# Patient Record
Sex: Male | Born: 1935 | Race: White | Hispanic: No | Marital: Married | Smoking: Former smoker
Health system: Southern US, Community
[De-identification: ages and names within clinical notes are randomized; demographics above are authoritative.]

## PROBLEM LIST (undated history)

## (undated) DIAGNOSIS — I10 Essential (primary) hypertension: Secondary | ICD-10-CM

## (undated) DIAGNOSIS — I509 Heart failure, unspecified: Secondary | ICD-10-CM

## (undated) DIAGNOSIS — E785 Hyperlipidemia, unspecified: Secondary | ICD-10-CM

## (undated) DIAGNOSIS — I2510 Atherosclerotic heart disease of native coronary artery without angina pectoris: Secondary | ICD-10-CM

## (undated) DIAGNOSIS — N189 Chronic kidney disease, unspecified: Secondary | ICD-10-CM

## (undated) DIAGNOSIS — J449 Chronic obstructive pulmonary disease, unspecified: Secondary | ICD-10-CM

---

## 2007-02-23 ENCOUNTER — Emergency Department: Payer: Self-pay | Admitting: Emergency Medicine

## 2008-02-12 IMAGING — CR DG HAND COMPLETE 3+V*L*
1 series · 3 of 3 positions shown · non-contrast
Comparison: none

REASON FOR EXAM: trauma
COMMENTS:

[Series 1: view not recorded · 0.17mm/px · 3 of 3 slices shown]
[im 1/3]
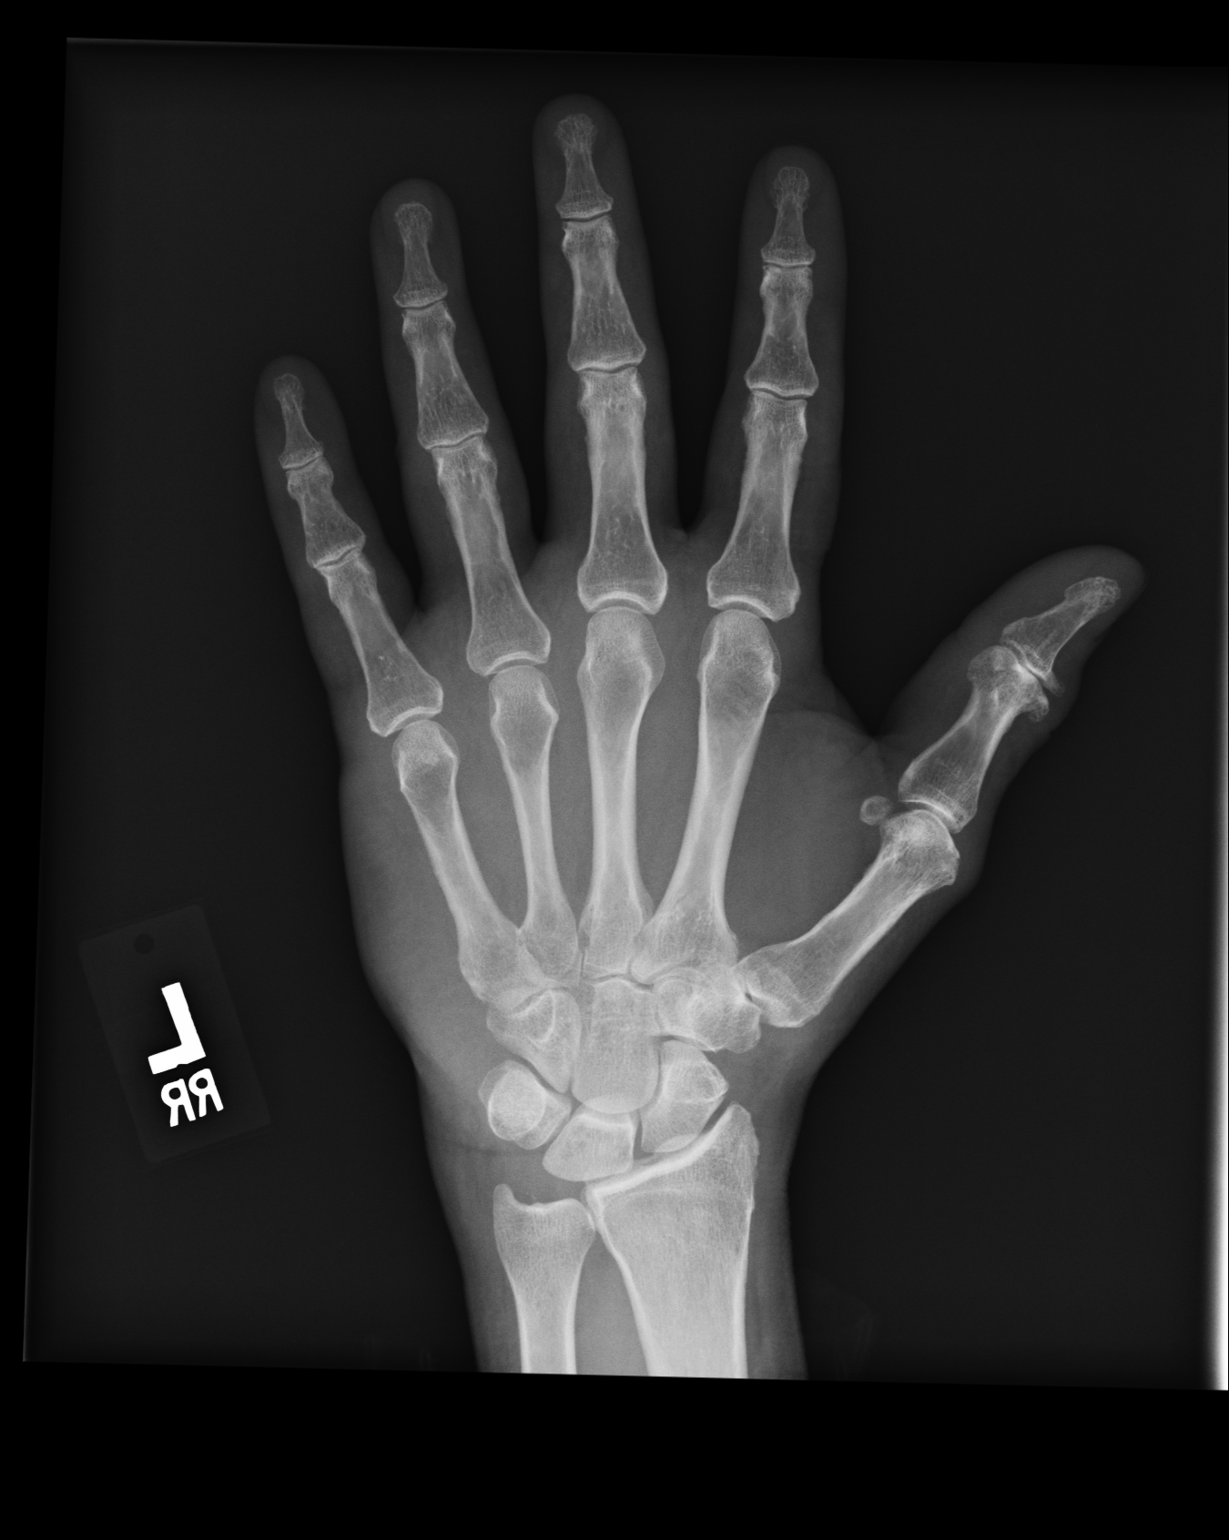
[im 2/3]
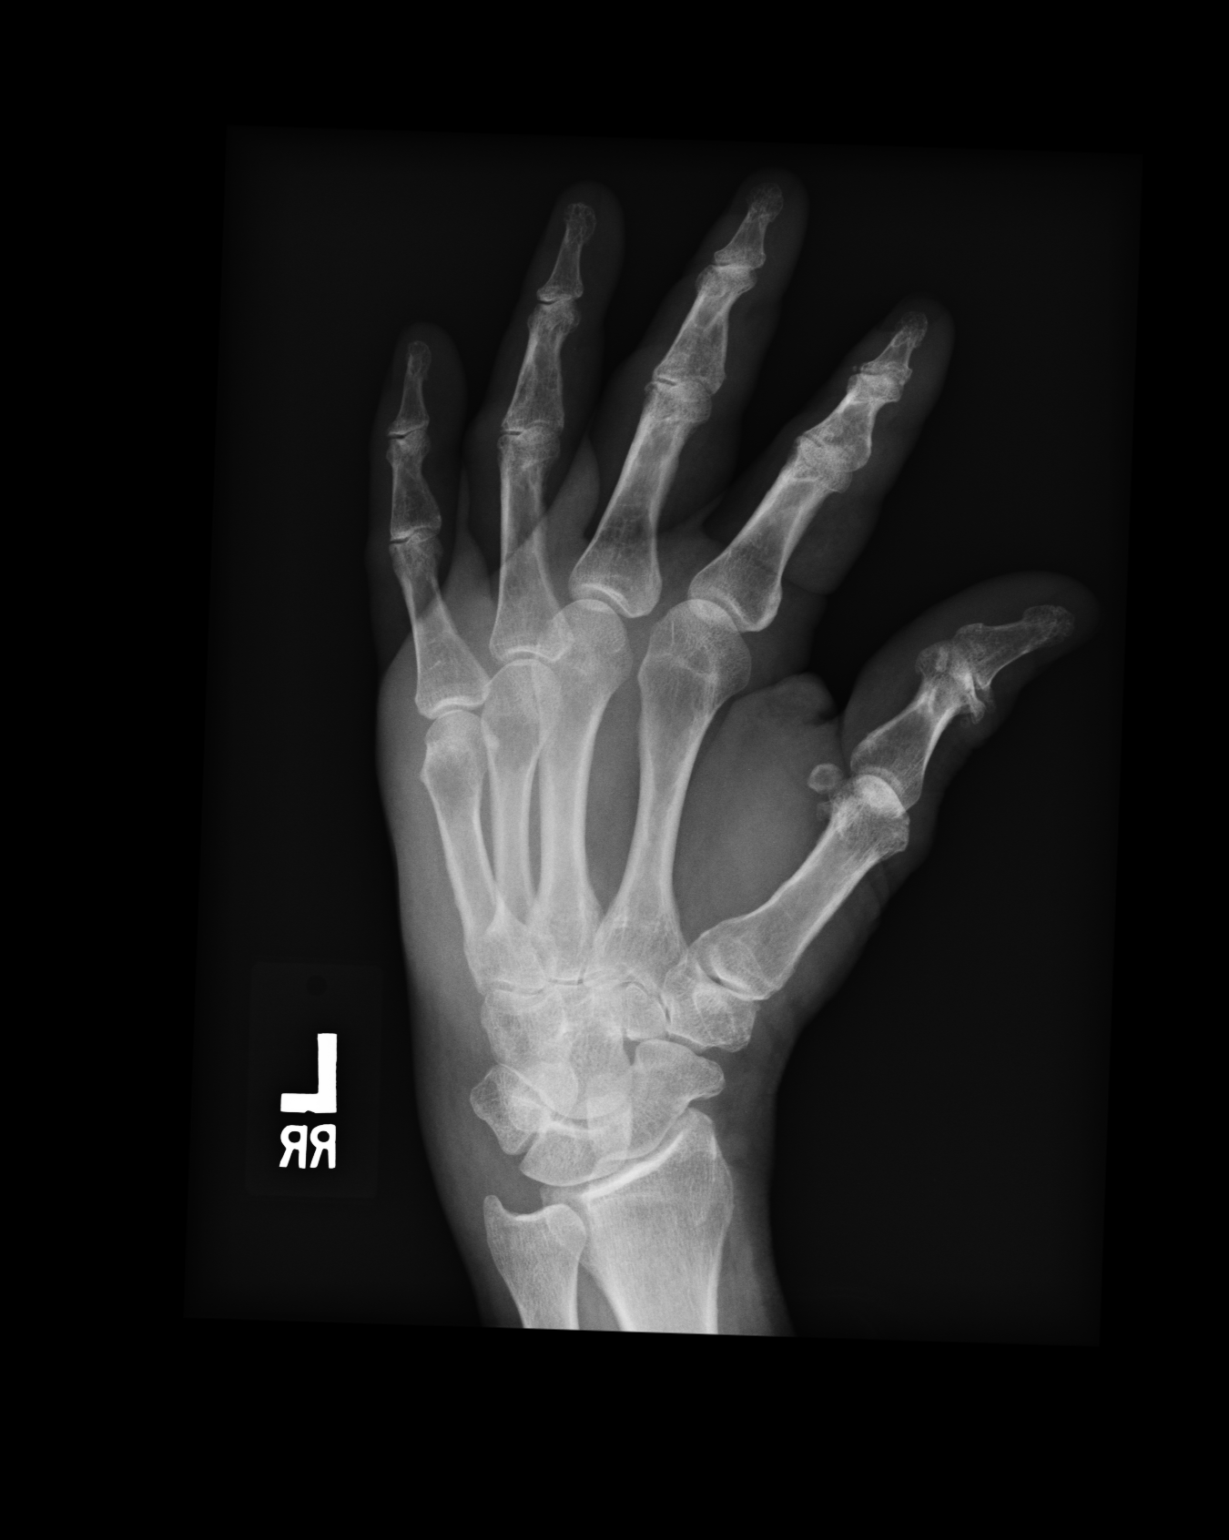
[im 3/3]
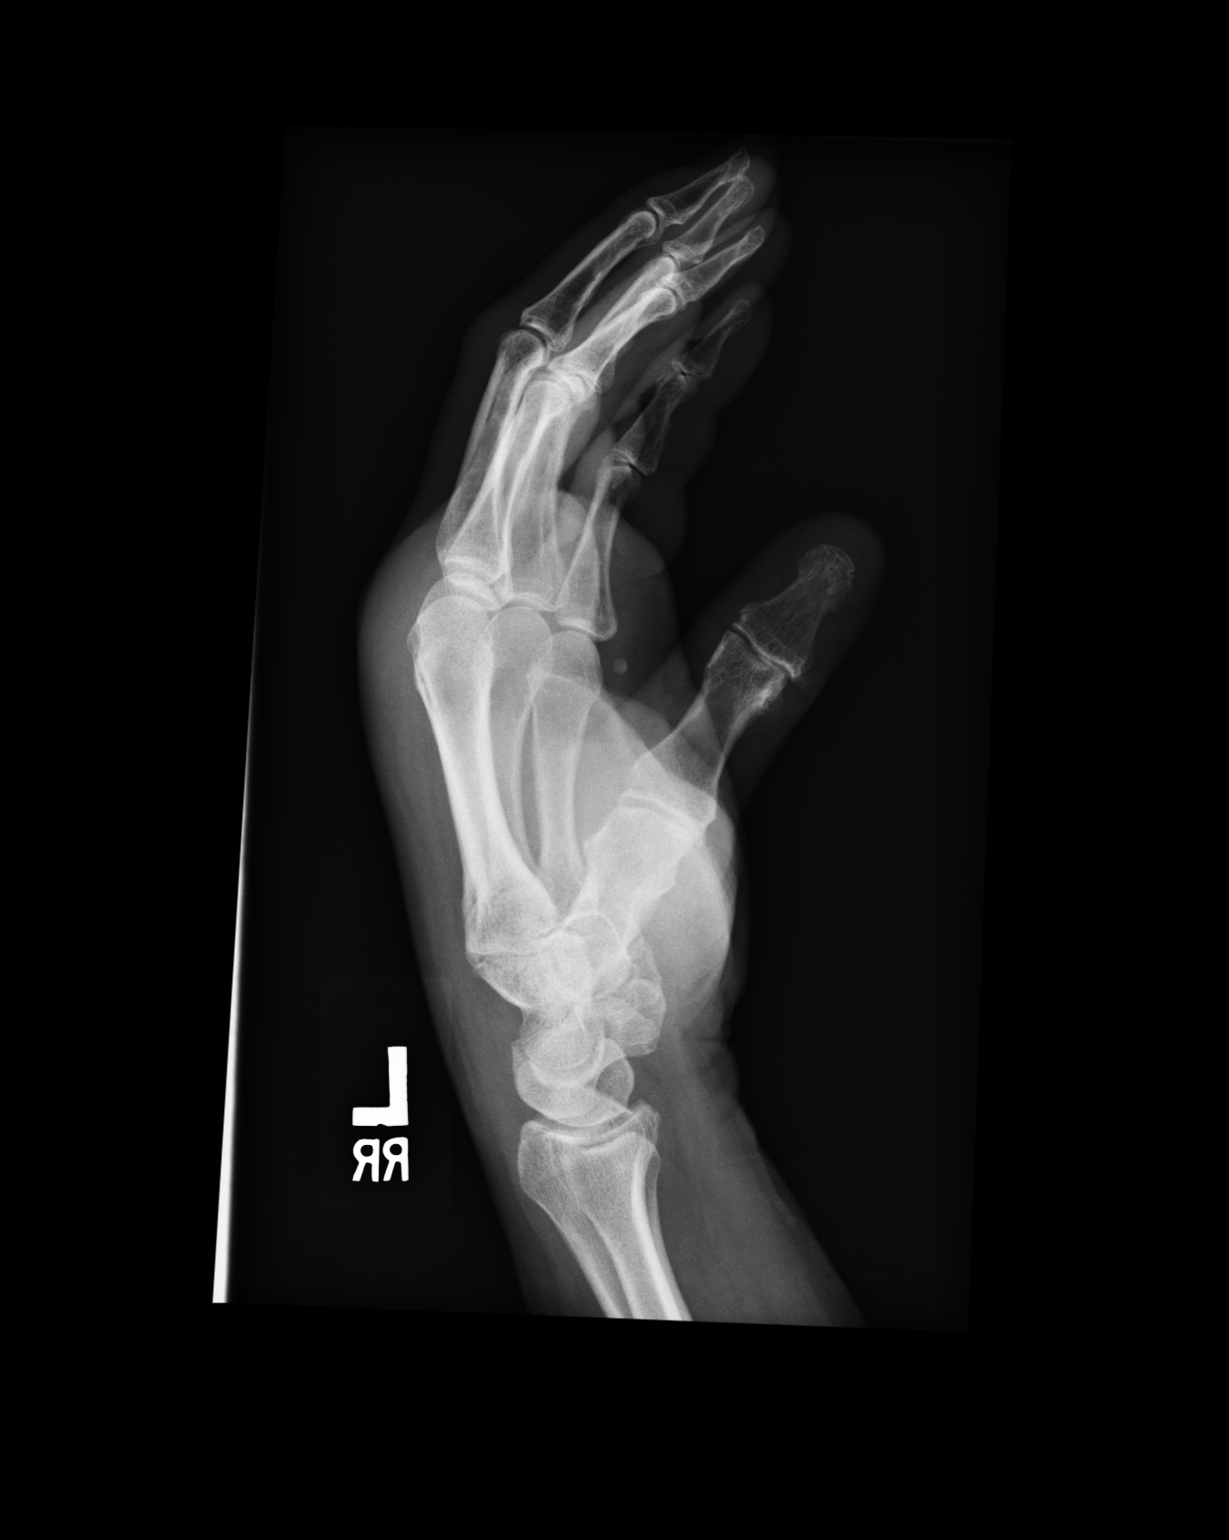

[3 of 3 positions shown; findings below may reference images not displayed]

PROCEDURE:     DXR - DXR HAND LT COMPLETE  W/OBLIQUES  - February 23, 2007 [DATE]

RESULT:       Lucency is noted in the second metacarpal is most likely a
vascular channel.  If the patient is having pain in this region, follow-up
plain films are suggested in 7-10 days to exclude fracture. Soft tissue
swelling is noted over the dorsum of the hand at the metacarpophalangeal
joint level.  No fracture in this region noted.
IMPRESSION: Subtle lucency at the base of the second metacarpal.  This is most likely a
vascular channel. See discussion above.

## 2021-01-03 DIAGNOSIS — U071 COVID-19: Secondary | ICD-10-CM

## 2021-01-03 HISTORY — DX: COVID-19: U07.1

## 2021-04-15 ENCOUNTER — Emergency Department: Payer: Medicare Other

## 2021-04-15 ENCOUNTER — Other Ambulatory Visit: Payer: Self-pay

## 2021-04-15 ENCOUNTER — Inpatient Hospital Stay: Payer: Medicare Other

## 2021-04-15 ENCOUNTER — Encounter: Payer: Self-pay | Admitting: Emergency Medicine

## 2021-04-15 ENCOUNTER — Inpatient Hospital Stay
Admission: EM | Admit: 2021-04-15 | Discharge: 2021-04-24 | DRG: 377 | Disposition: A | Discharge status: Skilled Nursing Facility | Payer: Medicare Other | Attending: Internal Medicine | Admitting: Internal Medicine

## 2021-04-15 DIAGNOSIS — K222 Esophageal obstruction: Secondary | ICD-10-CM | POA: Diagnosis present

## 2021-04-15 DIAGNOSIS — K5730 Diverticulosis of large intestine without perforation or abscess without bleeding: Secondary | ICD-10-CM | POA: Diagnosis present

## 2021-04-15 DIAGNOSIS — I214 Non-ST elevation (NSTEMI) myocardial infarction: Secondary | ICD-10-CM | POA: Diagnosis present

## 2021-04-15 DIAGNOSIS — Z974 Presence of external hearing-aid: Secondary | ICD-10-CM

## 2021-04-15 DIAGNOSIS — I10 Essential (primary) hypertension: Secondary | ICD-10-CM | POA: Diagnosis not present

## 2021-04-15 DIAGNOSIS — D62 Acute posthemorrhagic anemia: Secondary | ICD-10-CM | POA: Diagnosis present

## 2021-04-15 DIAGNOSIS — D649 Anemia, unspecified: Secondary | ICD-10-CM

## 2021-04-15 DIAGNOSIS — R680 Hypothermia, not associated with low environmental temperature: Secondary | ICD-10-CM | POA: Diagnosis present

## 2021-04-15 DIAGNOSIS — N12 Tubulo-interstitial nephritis, not specified as acute or chronic: Secondary | ICD-10-CM | POA: Diagnosis present

## 2021-04-15 DIAGNOSIS — Z20822 Contact with and (suspected) exposure to covid-19: Secondary | ICD-10-CM | POA: Diagnosis present

## 2021-04-15 DIAGNOSIS — K3189 Other diseases of stomach and duodenum: Secondary | ICD-10-CM | POA: Diagnosis not present

## 2021-04-15 DIAGNOSIS — I5041 Acute combined systolic (congestive) and diastolic (congestive) heart failure: Secondary | ICD-10-CM

## 2021-04-15 DIAGNOSIS — Z888 Allergy status to other drugs, medicaments and biological substances status: Secondary | ICD-10-CM

## 2021-04-15 DIAGNOSIS — K648 Other hemorrhoids: Secondary | ICD-10-CM | POA: Diagnosis present

## 2021-04-15 DIAGNOSIS — I25110 Atherosclerotic heart disease of native coronary artery with unstable angina pectoris: Secondary | ICD-10-CM | POA: Diagnosis present

## 2021-04-15 DIAGNOSIS — N179 Acute kidney failure, unspecified: Secondary | ICD-10-CM | POA: Diagnosis present

## 2021-04-15 DIAGNOSIS — Z87891 Personal history of nicotine dependence: Secondary | ICD-10-CM

## 2021-04-15 DIAGNOSIS — K921 Melena: Principal | ICD-10-CM

## 2021-04-15 DIAGNOSIS — J449 Chronic obstructive pulmonary disease, unspecified: Secondary | ICD-10-CM | POA: Diagnosis present

## 2021-04-15 DIAGNOSIS — Z8616 Personal history of COVID-19: Secondary | ICD-10-CM | POA: Diagnosis not present

## 2021-04-15 DIAGNOSIS — R079 Chest pain, unspecified: Secondary | ICD-10-CM | POA: Diagnosis not present

## 2021-04-15 DIAGNOSIS — N189 Chronic kidney disease, unspecified: Secondary | ICD-10-CM | POA: Diagnosis not present

## 2021-04-15 DIAGNOSIS — Z01810 Encounter for preprocedural cardiovascular examination: Secondary | ICD-10-CM | POA: Diagnosis not present

## 2021-04-15 DIAGNOSIS — K635 Polyp of colon: Secondary | ICD-10-CM | POA: Diagnosis present

## 2021-04-15 DIAGNOSIS — I130 Hypertensive heart and chronic kidney disease with heart failure and stage 1 through stage 4 chronic kidney disease, or unspecified chronic kidney disease: Secondary | ICD-10-CM | POA: Diagnosis present

## 2021-04-15 DIAGNOSIS — F1010 Alcohol abuse, uncomplicated: Secondary | ICD-10-CM | POA: Diagnosis present

## 2021-04-15 DIAGNOSIS — R339 Retention of urine, unspecified: Secondary | ICD-10-CM | POA: Diagnosis present

## 2021-04-15 DIAGNOSIS — I5043 Acute on chronic combined systolic (congestive) and diastolic (congestive) heart failure: Secondary | ICD-10-CM | POA: Diagnosis present

## 2021-04-15 DIAGNOSIS — E785 Hyperlipidemia, unspecified: Secondary | ICD-10-CM | POA: Diagnosis present

## 2021-04-15 DIAGNOSIS — N184 Chronic kidney disease, stage 4 (severe): Secondary | ICD-10-CM | POA: Diagnosis present

## 2021-04-15 DIAGNOSIS — D539 Nutritional anemia, unspecified: Secondary | ICD-10-CM | POA: Diagnosis present

## 2021-04-15 DIAGNOSIS — N1832 Chronic kidney disease, stage 3b: Secondary | ICD-10-CM | POA: Diagnosis not present

## 2021-04-15 DIAGNOSIS — E875 Hyperkalemia: Secondary | ICD-10-CM

## 2021-04-15 DIAGNOSIS — Z9181 History of falling: Secondary | ICD-10-CM

## 2021-04-15 DIAGNOSIS — K922 Gastrointestinal hemorrhage, unspecified: Secondary | ICD-10-CM | POA: Diagnosis present

## 2021-04-15 DIAGNOSIS — K449 Diaphragmatic hernia without obstruction or gangrene: Secondary | ICD-10-CM | POA: Diagnosis present

## 2021-04-15 DIAGNOSIS — I480 Paroxysmal atrial fibrillation: Secondary | ICD-10-CM | POA: Diagnosis present

## 2021-04-15 DIAGNOSIS — Z66 Do not resuscitate: Secondary | ICD-10-CM | POA: Diagnosis present

## 2021-04-15 DIAGNOSIS — I5089 Other heart failure: Secondary | ICD-10-CM

## 2021-04-15 DIAGNOSIS — E872 Acidosis, unspecified: Secondary | ICD-10-CM

## 2021-04-15 DIAGNOSIS — L819 Disorder of pigmentation, unspecified: Secondary | ICD-10-CM

## 2021-04-15 DIAGNOSIS — E538 Deficiency of other specified B group vitamins: Secondary | ICD-10-CM | POA: Diagnosis present

## 2021-04-15 DIAGNOSIS — I25118 Atherosclerotic heart disease of native coronary artery with other forms of angina pectoris: Secondary | ICD-10-CM | POA: Diagnosis not present

## 2021-04-15 DIAGNOSIS — A419 Sepsis, unspecified organism: Secondary | ICD-10-CM

## 2021-04-15 DIAGNOSIS — Z7141 Alcohol abuse counseling and surveillance of alcoholic: Secondary | ICD-10-CM

## 2021-04-15 DIAGNOSIS — I959 Hypotension, unspecified: Secondary | ICD-10-CM | POA: Diagnosis present

## 2021-04-15 DIAGNOSIS — D509 Iron deficiency anemia, unspecified: Secondary | ICD-10-CM | POA: Diagnosis not present

## 2021-04-15 DIAGNOSIS — Z881 Allergy status to other antibiotic agents status: Secondary | ICD-10-CM

## 2021-04-15 DIAGNOSIS — Z22322 Carrier or suspected carrier of Methicillin resistant Staphylococcus aureus: Secondary | ICD-10-CM

## 2021-04-15 DIAGNOSIS — I361 Nonrheumatic tricuspid (valve) insufficiency: Secondary | ICD-10-CM | POA: Diagnosis not present

## 2021-04-15 DIAGNOSIS — Z7901 Long term (current) use of anticoagulants: Secondary | ICD-10-CM

## 2021-04-15 HISTORY — DX: Heart failure, unspecified: I50.9

## 2021-04-15 HISTORY — DX: Hyperlipidemia, unspecified: E78.5

## 2021-04-15 HISTORY — DX: Chronic obstructive pulmonary disease, unspecified: J44.9

## 2021-04-15 HISTORY — DX: Essential (primary) hypertension: I10

## 2021-04-15 HISTORY — DX: Atherosclerotic heart disease of native coronary artery without angina pectoris: I25.10

## 2021-04-15 HISTORY — DX: Chronic kidney disease, unspecified: N18.9

## 2021-04-15 LAB — CBC WITH DIFFERENTIAL/PLATELET
Abs Immature Granulocytes: 0.49 10*3/uL — ABNORMAL HIGH (ref 0.00–0.07)
Abs Immature Granulocytes: 0.36 10*3/uL — ABNORMAL HIGH (ref 0.00–0.07)
Basophils Absolute: 0 10*3/uL (ref 0.0–0.1)
Basophils Absolute: 0 10*3/uL (ref 0.0–0.1)
Basophils Relative: 0 %
Basophils Relative: 0 %
Eosinophils Absolute: 0 10*3/uL (ref 0.0–0.5)
Eosinophils Absolute: 0 10*3/uL (ref 0.0–0.5)
Eosinophils Relative: 0 %
Eosinophils Relative: 0 %
HCT: 13.3 % — CL (ref 39.0–52.0)
HCT: 23.4 % — ABNORMAL LOW (ref 39.0–52.0)
Hemoglobin: 4.1 g/dL — CL (ref 13.0–17.0)
Hemoglobin: 7.4 g/dL — ABNORMAL LOW (ref 13.0–17.0)
Immature Granulocytes: 2 %
Immature Granulocytes: 2 %
Lymphocytes Relative: 5 %
Lymphocytes Relative: 7 %
Lymphs Abs: 1.1 10*3/uL (ref 0.7–4.0)
Lymphs Abs: 1.4 10*3/uL (ref 0.7–4.0)
MCH: 34.5 pg — ABNORMAL HIGH (ref 26.0–34.0)
MCH: 32 pg (ref 26.0–34.0)
MCHC: 30.8 g/dL (ref 30.0–36.0)
MCHC: 31.6 g/dL (ref 30.0–36.0)
MCV: 111.8 fL — ABNORMAL HIGH (ref 80.0–100.0)
MCV: 101.3 fL — ABNORMAL HIGH (ref 80.0–100.0)
Monocytes Absolute: 1.6 10*3/uL — ABNORMAL HIGH (ref 0.1–1.0)
Monocytes Absolute: 2.1 10*3/uL — ABNORMAL HIGH (ref 0.1–1.0)
Monocytes Relative: 7 %
Monocytes Relative: 11 %
Neutro Abs: 19.5 10*3/uL — ABNORMAL HIGH (ref 1.7–7.7)
Neutro Abs: 15.6 10*3/uL — ABNORMAL HIGH (ref 1.7–7.7)
Neutrophils Relative %: 86 %
Neutrophils Relative %: 80 %
Platelets: 269 10*3/uL (ref 150–400)
Platelets: 190 10*3/uL (ref 150–400)
RBC: 1.19 MIL/uL — ABNORMAL LOW (ref 4.22–5.81)
RBC: 2.31 MIL/uL — ABNORMAL LOW (ref 4.22–5.81)
RDW: 22 % — ABNORMAL HIGH (ref 11.5–15.5)
RDW: 18.5 % — ABNORMAL HIGH (ref 11.5–15.5)
Smear Review: NORMAL
WBC: 22.7 10*3/uL — ABNORMAL HIGH (ref 4.0–10.5)
WBC: 19.4 10*3/uL — ABNORMAL HIGH (ref 4.0–10.5)
nRBC: 0.7 % — ABNORMAL HIGH (ref 0.0–0.2)
nRBC: 1.2 % — ABNORMAL HIGH (ref 0.0–0.2)

## 2021-04-15 LAB — BASIC METABOLIC PANEL
Anion gap: 15 (ref 5–15)
Anion gap: 9 (ref 5–15)
BUN: 163 mg/dL — ABNORMAL HIGH (ref 8–23)
BUN: 157 mg/dL — ABNORMAL HIGH (ref 8–23)
CO2: 14 mmol/L — ABNORMAL LOW (ref 22–32)
CO2: 16 mmol/L — ABNORMAL LOW (ref 22–32)
Calcium: 8.6 mg/dL — ABNORMAL LOW (ref 8.9–10.3)
Calcium: 7.8 mg/dL — ABNORMAL LOW (ref 8.9–10.3)
Chloride: 107 mmol/L (ref 98–111)
Chloride: 109 mmol/L (ref 98–111)
Creatinine, Ser: 3.74 mg/dL — ABNORMAL HIGH (ref 0.61–1.24)
Creatinine, Ser: 3.41 mg/dL — ABNORMAL HIGH (ref 0.61–1.24)
GFR, Estimated: 15 mL/min — ABNORMAL LOW (ref >60)
GFR, Estimated: 17 mL/min — ABNORMAL LOW (ref >60)
Glucose, Bld: 135 mg/dL — ABNORMAL HIGH (ref 70–99)
Glucose, Bld: 122 mg/dL — ABNORMAL HIGH (ref 70–99)
Potassium: 5.7 mmol/L — ABNORMAL HIGH (ref 3.5–5.1)
Potassium: 5.3 mmol/L — ABNORMAL HIGH (ref 3.5–5.1)
Sodium: 136 mmol/L (ref 135–145)
Sodium: 134 mmol/L — ABNORMAL LOW (ref 135–145)

## 2021-04-15 LAB — FOLATE: Folate: 5.1 ng/mL — ABNORMAL LOW (ref >5.9)

## 2021-04-15 LAB — TROPONIN I (HIGH SENSITIVITY)
Troponin I (High Sensitivity): 369 ng/L (ref <18)
Troponin I (High Sensitivity): 535 ng/L (ref <18)
Troponin I (High Sensitivity): 6671 ng/L (ref <18)
Troponin I (High Sensitivity): 10535 ng/L (ref <18)

## 2021-04-15 LAB — PROTIME-INR
INR: 1.6 — ABNORMAL HIGH (ref 0.8–1.2)
Prothrombin Time: 19.4 seconds — ABNORMAL HIGH (ref 11.4–15.2)

## 2021-04-15 LAB — URINALYSIS, COMPLETE (UACMP) WITH MICROSCOPIC
Bacteria, UA: NONE SEEN
Bilirubin Urine: NEGATIVE
Glucose, UA: NEGATIVE mg/dL
Hgb urine dipstick: NEGATIVE
Ketones, ur: NEGATIVE mg/dL
Leukocytes,Ua: NEGATIVE
Nitrite: NEGATIVE
Protein, ur: NEGATIVE mg/dL
Specific Gravity, Urine: 1.013 (ref 1.005–1.030)
pH: 5 (ref 5.0–8.0)

## 2021-04-15 LAB — HEPATIC FUNCTION PANEL
ALT: 15 U/L (ref 0–44)
AST: 31 U/L (ref 15–41)
Albumin: 2.8 g/dL — ABNORMAL LOW (ref 3.5–5.0)
Alkaline Phosphatase: 85 U/L (ref 38–126)
Bilirubin, Direct: <0.1 mg/dL (ref 0.0–0.2)
Total Bilirubin: 0.7 mg/dL (ref 0.3–1.2)
Total Protein: 4.8 g/dL — ABNORMAL LOW (ref 6.5–8.1)

## 2021-04-15 LAB — GLUCOSE, CAPILLARY
Glucose-Capillary: 103 mg/dL — ABNORMAL HIGH (ref 70–99)
Glucose-Capillary: 111 mg/dL — ABNORMAL HIGH (ref 70–99)
Glucose-Capillary: 118 mg/dL — ABNORMAL HIGH (ref 70–99)
Glucose-Capillary: 127 mg/dL — ABNORMAL HIGH (ref 70–99)
Glucose-Capillary: 91 mg/dL (ref 70–99)
Glucose-Capillary: 93 mg/dL (ref 70–99)

## 2021-04-15 LAB — CBC
HCT: 13.2 % — CL (ref 39.0–52.0)
Hemoglobin: 4.1 g/dL — CL (ref 13.0–17.0)
MCH: 34.2 pg — ABNORMAL HIGH (ref 26.0–34.0)
MCHC: 31.1 g/dL (ref 30.0–36.0)
MCV: 110 fL — ABNORMAL HIGH (ref 80.0–100.0)
Platelets: 268 10*3/uL (ref 150–400)
RBC: 1.2 MIL/uL — ABNORMAL LOW (ref 4.22–5.81)
RDW: 22.2 % — ABNORMAL HIGH (ref 11.5–15.5)
WBC: 23.3 10*3/uL — ABNORMAL HIGH (ref 4.0–10.5)
nRBC: 0.6 % — ABNORMAL HIGH (ref 0.0–0.2)

## 2021-04-15 LAB — IRON AND TIBC
Iron: 39 ug/dL — ABNORMAL LOW (ref 45–182)
Saturation Ratios: 14 % — ABNORMAL LOW (ref 17.9–39.5)
TIBC: 272 ug/dL (ref 250–450)
UIBC: 233 ug/dL

## 2021-04-15 LAB — HEMOGLOBIN AND HEMATOCRIT, BLOOD
HCT: 23.4 % — ABNORMAL LOW (ref 39.0–52.0)
HCT: 23.4 % — ABNORMAL LOW (ref 39.0–52.0)
Hemoglobin: 7.6 g/dL — ABNORMAL LOW (ref 13.0–17.0)
Hemoglobin: 7.7 g/dL — ABNORMAL LOW (ref 13.0–17.0)

## 2021-04-15 LAB — LIPID PANEL
Cholesterol: 78 mg/dL (ref 0–200)
HDL: 24 mg/dL — ABNORMAL LOW (ref >40)
LDL Cholesterol: 33 mg/dL (ref 0–99)
Total CHOL/HDL Ratio: 3.3 RATIO
Triglycerides: 105 mg/dL (ref <150)
VLDL: 21 mg/dL (ref 0–40)

## 2021-04-15 LAB — PHOSPHORUS: Phosphorus: 3.5 mg/dL (ref 2.5–4.6)

## 2021-04-15 LAB — RESP PANEL BY RT-PCR (FLU A&B, COVID) ARPGX2
Influenza A by PCR: NEGATIVE
Influenza B by PCR: NEGATIVE
SARS Coronavirus 2 by RT PCR: NEGATIVE

## 2021-04-15 LAB — FERRITIN: Ferritin: 38 ng/mL (ref 24–336)

## 2021-04-15 LAB — ABO/RH: ABO/RH(D): A NEG

## 2021-04-15 LAB — PREPARE RBC (CROSSMATCH)

## 2021-04-15 LAB — VITAMIN B12: Vitamin B-12: 357 pg/mL (ref 180–914)

## 2021-04-15 LAB — BRAIN NATRIURETIC PEPTIDE
B Natriuretic Peptide: 442.8 pg/mL — ABNORMAL HIGH (ref 0.0–100.0)
B Natriuretic Peptide: 650.6 pg/mL — ABNORMAL HIGH (ref 0.0–100.0)

## 2021-04-15 LAB — TSH: TSH: 2.065 u[IU]/mL (ref 0.350–4.500)

## 2021-04-15 LAB — LACTIC ACID, PLASMA
Lactic Acid, Venous: 6.7 mmol/L (ref 0.5–1.9)
Lactic Acid, Venous: 5.8 mmol/L (ref 0.5–1.9)
Lactic Acid, Venous: 1.3 mmol/L (ref 0.5–1.9)

## 2021-04-15 LAB — MAGNESIUM: Magnesium: 2.1 mg/dL (ref 1.7–2.4)

## 2021-04-15 LAB — MRSA PCR SCREENING: MRSA by PCR: POSITIVE — AB

## 2021-04-15 MED ORDER — LACTATED RINGERS IV SOLN: INTRAVENOUS | Status: DC

## 2021-04-15 MED ORDER — POLYETHYLENE GLYCOL 3350 17 G PO PACK: 17.0000 g | PACK | Freq: Every day | ORAL | Status: DC | PRN

## 2021-04-15 MED ORDER — MELATONIN 5 MG PO TABS
20.0000 mg | ORAL_TABLET | Freq: Every day | ORAL | Status: DC
  Administered 2021-04-15 – 2021-04-23 (×9): 20 mg via ORAL
  Filled 2021-04-15 (×9): qty 4

## 2021-04-15 MED ORDER — PANTOPRAZOLE SODIUM 40 MG IV SOLR
40.0000 mg | Freq: Two times a day (BID) | INTRAVENOUS | Status: DC
  Administered 2021-04-18 – 2021-04-19 (×2): 40 mg via INTRAVENOUS
  Filled 2021-04-15 (×2): qty 40

## 2021-04-15 MED ORDER — SODIUM CHLORIDE 0.9 % IV SOLN
2.0000 g | Freq: Once | INTRAVENOUS | Status: AC
  Administered 2021-04-15: 2 g via INTRAVENOUS
  Filled 2021-04-15: qty 2

## 2021-04-15 MED ORDER — CHLORHEXIDINE GLUCONATE CLOTH 2 % EX PADS
6.0000 | MEDICATED_PAD | Freq: Every day | CUTANEOUS | Status: DC
  Administered 2021-04-17 – 2021-04-20 (×3): 6 via TOPICAL
  Filled 2021-04-15: qty 6

## 2021-04-15 MED ORDER — SODIUM CHLORIDE 0.9 % IV SOLN
8.0000 mg/h | INTRAVENOUS | Status: AC
  Administered 2021-04-15 – 2021-04-17 (×5): 8 mg/h via INTRAVENOUS
  Filled 2021-04-15 (×6): qty 80

## 2021-04-15 MED ORDER — METRONIDAZOLE 500 MG/100ML IV SOLN
500.0000 mg | Freq: Once | INTRAVENOUS | Status: AC
  Administered 2021-04-15: 500 mg via INTRAVENOUS
  Filled 2021-04-15: qty 100

## 2021-04-15 MED ORDER — METOPROLOL TARTRATE 5 MG/5ML IV SOLN: 5.0000 mg | Freq: Four times a day (QID) | INTRAVENOUS | Status: DC | PRN

## 2021-04-15 MED ORDER — VANCOMYCIN HCL IN DEXTROSE 1-5 GM/200ML-% IV SOLN: 1000.0000 mg | Freq: Once | INTRAVENOUS | Status: DC

## 2021-04-15 MED ORDER — PROTHROMBIN COMPLEX CONC HUMAN 1000 UNITS IV KIT
4233.0000 [IU] | PACK | Status: AC
  Administered 2021-04-15: 4233 [IU] via INTRAVENOUS
  Filled 2021-04-15: qty 4233

## 2021-04-15 MED ORDER — DOCUSATE SODIUM 100 MG PO CAPS
100.0000 mg | ORAL_CAPSULE | Freq: Two times a day (BID) | ORAL | Status: DC | PRN
  Administered 2021-04-20 – 2021-04-23 (×2): 100 mg via ORAL
  Filled 2021-04-15 (×2): qty 1

## 2021-04-15 MED ORDER — CARVEDILOL 3.125 MG PO TABS
3.1250 mg | ORAL_TABLET | Freq: Two times a day (BID) | ORAL | Status: DC
  Administered 2021-04-16 – 2021-04-17 (×3): 3.125 mg via ORAL
  Filled 2021-04-15 (×3): qty 1

## 2021-04-15 MED ORDER — SODIUM CHLORIDE 0.9 % IV SOLN: 1.0000 g | INTRAVENOUS | Status: DC

## 2021-04-15 MED ORDER — SODIUM CHLORIDE 0.9 % IV SOLN: 10.0000 mL/h | Freq: Once | INTRAVENOUS | Status: AC

## 2021-04-15 MED ORDER — VANCOMYCIN HCL 1750 MG/350ML IV SOLN
1750.0000 mg | Freq: Once | INTRAVENOUS | Status: AC
  Administered 2021-04-15: 1750 mg via INTRAVENOUS
  Filled 2021-04-15: qty 350

## 2021-04-15 MED ORDER — FUROSEMIDE 10 MG/ML IJ SOLN
40.0000 mg | Freq: Once | INTRAMUSCULAR | Status: AC
  Administered 2021-04-15: 40 mg via INTRAVENOUS
  Filled 2021-04-15: qty 4

## 2021-04-15 MED ORDER — SODIUM CHLORIDE 0.9 % IV SOLN
2.0000 g | INTRAVENOUS | Status: DC
  Administered 2021-04-15 – 2021-04-16 (×2): 2 g via INTRAVENOUS
  Filled 2021-04-15 (×3): qty 2

## 2021-04-15 MED ORDER — SODIUM CHLORIDE 0.9 % IV BOLUS (SEPSIS)
1000.0000 mL | Freq: Once | INTRAVENOUS | Status: AC
  Administered 2021-04-15: 1000 mL via INTRAVENOUS

## 2021-04-15 MED ORDER — LACTATED RINGERS IV BOLUS
1500.0000 mL | Freq: Once | INTRAVENOUS | Status: AC
  Administered 2021-04-15: 1500 mL via INTRAVENOUS

## 2021-04-15 MED ORDER — SODIUM CHLORIDE 0.9 % IV SOLN
80.0000 mg | Freq: Once | INTRAVENOUS | Status: AC
  Administered 2021-04-15: 80 mg via INTRAVENOUS
  Filled 2021-04-15: qty 80

## 2021-04-15 MED ORDER — LACTATED RINGERS IV BOLUS: 1000.0000 mL | Freq: Once | INTRAVENOUS | Status: DC

## 2021-04-15 MED ORDER — MUPIROCIN 2 % EX OINT
1.0000 "application " | TOPICAL_OINTMENT | Freq: Two times a day (BID) | CUTANEOUS | Status: AC
  Administered 2021-04-15 – 2021-04-20 (×9): 1 via NASAL
  Filled 2021-04-15: qty 22

## 2021-04-15 NOTE — H&P (Signed)
NAME:  Gabriel King, MRN:  [id number], DOB:  [birth year], LOS: 0 ADMISSION DATE:  04/15/2021, CONSULTATION DATE:  04/15/21 REFERRING MD:  Dr. Leonides Schanz, CHIEF COMPLAINT:  Chest Pain  History of Present Illness:  87 yo M presenting to [facility] ED via EMS with complaints of chest pain. Patient received full dose ASA by EMS. Patient and wife are traveling from [place], medical records not currently available, home hospital is [facility] in [place]. (ED is requesting records) ED course: Upon arrival to ED patient described tightness and pressure across his anterior chest into his shoulders with associated dyspnea. Per ED documentation he reported this pain as waxing and waning over the past 2 days and had currently improved to 3/10. Patient initially reported chills, but no fever/cough/vomiting. He also reported loose black stools over the past 2 days with associated fatigue/weakness, while on Eliquis for AFib, last dose 2300 on 04/14/21.  Patient stated he had an EGD & colonoscopy "years ago" in [place], reported a history of polyps but no previous GIB or transfusion. Patient did also admit to drinking 5 oz of vodka daily, denied any known history of esophageal varices. Initial Vitals: Afebrile- 97.5 > 96.8, NSR 72, BP marginal: 111/54 (64), SpO2 100% on room air & RR stable at 14. Significant labs: [id number], metabolic 123XX123, renal failure- 163/3.74, elevated troponin-369, lactic acidosis-6.7, leukocytosis- 23.3, anemia with Hgb-4.1, INR stable-1.6, Abdominal CT-concerning for left sided pyelonephritis, cystitis.  Patient actively bleeding with guaiac positive melena and Kcentra ordered as well as emergent 4 units of pRBC's, protonix bolus and drip. Dr. Leonides Schanz spoke with GI MD on call Dr. Marius Ditch who agreed with all interventions.   Pertinent  Medical History  CAD s/p 3 stents HTN CKD (unknown staging) Atrial Fibrillation on Eliquis COPD - not on oxygen HLD CHF Former smoker - 70 pack  year history COVID-19 (February 2022)  Significant Hospital Events: Including procedures, antibiotic start and stop dates in addition to other pertinent events   . Admit to ICU with acute blood loss anemia, Hgb of 4.1, requiring emergent transfusion.  Interim History / Subjective:  Patient alert & responsive, HOH with hearing aides present. Patient denies any fever/chills/nausea/vomiting or productive cough. He reports intermittent chest pain/pressure with associated dyspnea that correlates with palpitations/activity and improves with toprol administration/rest. NO CURRENT CHEST PAIN. Patient also had recent cellulitis on RLE, site appears clean and dry with minimal serous drainage. Patient admits to taking Advil multiple times a day for pain and drinking "2 to 3 fingers of vodka daily" Objective   Blood pressure (!) 122/95, pulse 72, temperature (!) 96.8 F (36 C), temperature source Rectal, resp. rate 20, height '5\' 8"'$  (1.727 m), weight 76.2 kg, SpO2 100 %.       No intake or output data in the 24 hours ending 04/15/21 0354 Filed Weights   04/15/21 0108  Weight: 76.2 kg    Examination: General: Adult male, critically ill, lying in bed, NAD, fatigued  HEENT: MM pink/moist, anicteric, atraumatic, neck supple, glasses in place, HOH with hearing aides Neuro: A&O x 4, able to follow commands, PERRL +3, MAE CV: s1s2 RRR, NSR on monitor, no r/m/g Pulm: Regular, non labored on room air, breath sounds clear-BUL & diminished-BLL GI: soft, rounded, non tender, bs x 4 Skin: RLE wound, clean/dry covered with clean dressing, scattered ecchymosis/skin tears on BUE Extremities: warm/dry, pulses + 2 R/P, +1 edema noted BLE  Labs/imaging that I have personally reviewed  (right click and "Reselect all SmartList  Selections" daily)  EKG Interpretation Date: 04/15/21 EKG Time: 01:03 Rate: 69 Rhythm: NSR QRS Axis:  normal Intervals: normal ST/T Wave abnormalities: none Narrative Interpretation:  NSR  Na+/ K+: 136/5.7 BUN/Cr.: 163/3.74 Serum CO2/ AG: 14/15  Hgb: 4.1 Troponin: 369 BNP: 442.8  WBC/ TMAX: 23.3/ afebrile Lactic: 6.7 UA: negative  CXR 04/15/21: chronic lung changes without evidence of acute disease CT abdomen/pelvis 04/15/21: L > R perinephric stranding, nonspecific but can be seen in pyelonephritis- correlate with UA recommended. Mild symmetric bladder wall thickening at least in part related under distention- correlate with UA to assess cystitis. Sq edema involving posterior soft tissues. Indeterminate 1.8 cm LEFT lower pole renal lesion, further eval with renal US recommended. LEFT sided colonic diverticulosis without diverticulitis Resolved Hospital Problem list     Assessment & Plan:  Acute Blood loss anemia due to Suspected Lower Gastrointestinal Bleed in the setting of [place]  PMHx: polyps, ETOH daily use, A-fib on [place] Anticoagulation reversed with Kcentra in ED & Emergent blood transfusion initiated, for a total of 4 units. Hgb 4.1 on CBC. Patient denies history of varices on previous EGD, Dr. Leonides Schanz reported speaking with Dr. Marius Ditch who deferred Octreotide infusion at this time. Patient received Protonix bolus and drip initiated - continue Protonix infusion - Maintain up to date type & screen - continuous cardiac monitoring - NPO - Monitor for s/s of bleeding - Daily CBC, PT/ INR monitoring PRN - Transfuse for Hgb <7 - GI consulted, appreciate input  Severe Lactic Acidosis in the setting of Acute blood loss anemia Leukocytosis Suspected Sepsis- post septic protocol  Lactic: 6.7, Baseline PCT: (not useful in Acute renal failure, UA: negative CXR: negative, CT abdomen: concern for pyelonephritis & cystitis  Initial interventions/workup included: 2 L of NS/LR & Cefepime/ Vancomycin/ Metronidazole. Initial concern for pyelonephritis/cystitis from CT abdomen, UA however doesn't correlate with acute infection. Favor lactic acidosis in the setting of severe anemia,  will hold on further Abx at this time. - Supplemental oxygen as needed, to maintain SpO2 > 90% - f/u cultures, trend lactic/ PCT - Daily CBC - monitor WBC/ fever curve - will hold on further IV antibiotics at this time - IVF hydration as needed - Consider vasopressors to maintain MAP< 65, if needed - Strict I/O's: alert provider if UOP < 0.5 mL/kg/hr  Suspected Acute Kidney Injury in the setting of CKD secondary to Acute blood loss anemia Non-AG Metabolic Acidosis Hyperkalemia Baseline Cr: unknown, Cr on admission: 3.74 - Strict I/O's: alert provider if UOP < 0.5 mL/kg/hr - gentle IVF hydration  - Daily BMP, replace electrolytes PRN - Avoid nephrotoxic agents as able, ensure adequate renal perfusion - Consult nephrology if iHD or CRRT indicated  - renal US  Elevated Troponin secondary to demand ischemia vs N-STEMI PMHx: Atrial Fibrillation, CHF, CAD s/p 1 stent (2 more stents place in groin per pt.) Currently the patient is in NSR, describes intermittent CP w/ associated dyspnea- no active CP currently. Unable to anticoagulate due to GIB - holding [place]: Eliquis due to melena - holding home diuretic d/t marginal BP & AKI, restart as tolerated - trend troponin  HTN HLD - holding home regimen in the setting of acute blood loss and marginal BP - consider restarting Lipitor 40 mg daily & losartan 25 mg daily  Best practice (right click and "Reselect all SmartList Selections" daily)  Diet:  NPO Pain/Anxiety/Delirium protocol (if indicated): No VAP protocol (if indicated): Not indicated DVT prophylaxis: Contraindicated GI prophylaxis: PPI Glucose control:  SSI No Central venous access:  N/A Arterial line:  N/A Foley:  N/A Mobility:  bed rest  PT consulted: N/A Last date of multidisciplinary goals of care discussion 04/15/21 Code Status:  DNR Disposition: ICU  Labs   CBC: Recent Labs  Lab 04/15/21 0111  WBC 22.7*  23.3*  NEUTROABS 19.5*  HGB 4.1*  4.1*  HCT 13.3*   13.2*  MCV 111.8*  110.0*  PLT 269  [id number]    Basic Metabolic Panel: Recent Labs  Lab 04/15/21 0111  NA 136  K 5.7*  CL 107  CO2 14*  GLUCOSE 135*  BUN 163*  CREATININE 3.74*  CALCIUM 8.6*   GFR: Estimated Creatinine Clearance: 13.7 mL/min (A) (by C-G formula based on SCr of 3.74 mg/dL (H)). Recent Labs  Lab 04/15/21 0111 04/15/21 0132  WBC 22.7*  23.3*  --   LATICACIDVEN  --  6.7*    Liver Function Tests: Recent Labs  Lab 04/15/21 0111  AST 31  ALT 15  ALKPHOS 85  BILITOT 0.7  PROT 4.8*  ALBUMIN 2.8*   No results for input(s): LIPASE, AMYLASE in the last 168 hours. No results for input(s): AMMONIA in the last 168 hours.  ABG No results found for: PHART, PCO2ART, PO2ART, HCO3, TCO2, ACIDBASEDEF, O2SAT   Coagulation Profile: Recent Labs  Lab 04/15/21 0111  INR 1.6*    Cardiac Enzymes: No results for input(s): CKTOTAL, CKMB, CKMBINDEX, TROPONINI in the last 168 hours.  HbA1C: No results found for: HGBA1C  CBG: No results for input(s): GLUCAP in the last 168 hours.  Review of Systems: Positives in BOLD  Gen: Denies fever, chills, weight change, fatigue, night sweats HEENT: Denies blurred vision, double vision, hearing loss, tinnitus, sinus congestion, rhinorrhea, sore throat, neck stiffness, dysphagia PULM: Denies shortness of breath, cough, sputum production, hemoptysis, wheezing CV: Denies chest pain, edema, orthopnea, paroxysmal nocturnal dyspnea, palpitations GI: Denies abdominal pain, nausea, vomiting, diarrhea, hematochezia, melena, constipation, change in bowel habits GU: Denies dysuria, hematuria, polyuria, oliguria, urethral discharge Endocrine: Denies hot or cold intolerance, polyuria, polyphagia or appetite change Derm: Denies rash, dry skin, scaling or peeling skin change Heme: Denies easy bruising, bleeding, bleeding gums Neuro: Denies headache, numbness, weakness, slurred speech, loss of memory or consciousness  Past Medical History:   He,  has a past medical history of CKD (chronic kidney disease), Coronary artery disease, and Hypertension.   Surgical History:  History reviewed. No pertinent surgical history.   Social History:      Family History:  His family history is not on file.   Allergies Allergies  Allergen Reactions  . Ciprofloxacin   . Invanz [Ertapenem]      Home Medications  Prior to Admission medications   Not on File     Critical care time: 44 minutes       Venetia Night, AGACNP-BC Acute Care Nurse Practitioner [place]   [phone] / [phone] Please see Amion for pager details.

## 2021-04-15 NOTE — Progress Notes (Signed)
Pharmacy Antibiotic Note  Gabriel King is a 87 y.o. male admitted on 04/15/2021. Pharmacy has been consulted for cefepime dosing.  Plan: Cefepime 2 g IV q24h based on current renal function.   Suspected AKI in setting of CKD. Baseline creatinine unknown.  Height: '5\' 11"'$  (180.3 cm) Weight: 76.8 kg (169 lb 5 oz) IBW/kg (Calculated) : 75.3  Temp (24hrs), Avg:96.6 F (35.9 C), Min:93.3 F (34.1 C), Max:97.8 F (36.6 C)  Recent Labs  Lab 04/15/21 0111 04/15/21 0132 04/15/21 0318  WBC 22.7*  23.3*  --   --   CREATININE 3.74*  --   --   LATICACIDVEN  --  6.7* 5.8*    Estimated Creatinine Clearance: 15.1 mL/min (A) (by C-G formula based on SCr of 3.74 mg/dL (H)).    Allergies  Allergen Reactions  . Ciprofloxacin   . Invanz [Ertapenem]     Antimicrobials this admission: Vancomycin 5/14 x 1 Metronidazole 5/14 x 1 Cefepime 5/14 >>   Microbiology results: 5/14 BCx: pending 5/14 UCx: pending  5/14 MRSA PCR: pending  Thank you for allowing pharmacy to be a part of this patient's care.  Tawnya Crook, PharmD 04/15/2021 7:11 AM

## 2021-04-15 NOTE — ED Notes (Signed)
Critical TROP 369-notified Dr. Leonides Schanz.  No additional orders.

## 2021-04-15 NOTE — Progress Notes (Signed)
[facility] Code Sepsis follow up:  Pt admitted to ICU for further monitoring. First and second LA elevated, 6.7 and 5.8. Trending down but need a third to see if fluid resuscitation effective. Message sent to ICU RN to follow up with third LA. Had BC drawn, ABX administered and IVF boluses per weight. Continue to monitor.  Hatsumi Steinhart DNP El[facility] RN [id number] AM

## 2021-04-15 NOTE — Progress Notes (Signed)
Code Sepsis initiated @ 0159 AM, [facility] following.

## 2021-04-15 NOTE — Consult Note (Signed)
PHARMACY CONSULT NOTE - FOLLOW UP  Pharmacy Consult for Electrolyte Monitoring and Replacement   Recent Labs: Potassium (mmol/L)  Date Value  04/15/2021 5.3 (H)   Magnesium (mg/dL)  Date Value  04/15/2021 2.1   Calcium (mg/dL)  Date Value  04/15/2021 7.8 (L)   Albumin (g/dL)  Date Value  04/15/2021 2.8 (L)   Phosphorus (mg/dL)  Date Value  04/15/2021 3.5   Sodium (mmol/L)  Date Value  04/15/2021 134 (L)   Corrected Ca 8.76  Assessment: 87 yo male with active GIB on Eliquis at home (last dose 5/13 11pm).  S/P Kcentra administration and blood transfusion.  Patient is mildly hyperkalemic.  Pharmacy has been consulted to monitor and replenish electrolytes.  Goal of Therapy:  Electrolytes wnl's  Plan:  No replenishment warranted at this time - will continue to monitor  Gabriel King ,PharmD Clinical Pharmacist 04/15/2021 8:17 AM

## 2021-04-15 NOTE — ED Provider Notes (Signed)
[facility] Emergency Department Provider Note  ____________________________________________   Event Date/Time   First MD Initiated Contact with Patient 04/15/21 0101     (approximate)  I have reviewed the triage vital signs and the nursing notes.   HISTORY  Chief Complaint Chest Pain (Increasingly worse chest pain x a few days, weakness.  Characterizes pain as "pressure."  Catheterization with stenting in the past.  Blood pressure soft in the EMS, 90/67 upon arrival to room 1.  Aox4.  4x81 ASA received by EMS.  /)    HPI Gabriel King is a 87 y.o. male with history of coronary artery disease status post 3 stents, possible CHF, HTN, chronic kidney disease, atrial fibrillation on Eliquis who presents to the emergency department EMS for concerns for chest pain.  Describes it as a tightness, pressure across his anterior chest into his shoulders with associated shortness of breath.  He states it has been ongoing for 2 days and will wax and wane.  Has improved down to a 3/10.  Was given full dose aspirin with EMS.  Found to be slightly hypotensive here.  Denies fevers but has had chills.  No cough, vomiting.  States he has had loose stools that are black in nature.  States he stopped taking iron tablets several days ago.  He is unable to tell me why he takes iron.  He states that he lives in [place] and he and his wife drove here to [place] over the past couple of days.  Reports last endoscopy and colonoscopy was years ago in [place].  States history of polyps but no history of previous GI bleed or transfusion.  Denies known history of esophageal varices or cirrhosis.  Does state that he drinks 5 ounces of vodka every night.      Confirmed with the patient and wife that he is a DNR/DNI.  Past Medical History:  Diagnosis Date  . CKD (chronic kidney disease)   . Coronary artery disease   . Hypertension     Patient Active Problem List   Diagnosis Date Noted  .  GIB (gastrointestinal bleeding) 04/15/2021    History reviewed. No pertinent surgical history.  Prior to Admission medications   Not on File    Allergies Ciprofloxacin and Invanz [ertapenem]  History reviewed. No pertinent family history.  Social History    Review of Systems Constitutional: No fever. Eyes: No visual changes. ENT: No sore throat. Cardiovascular:+ chest pain. Respiratory: +  shortness of breath. Gastrointestinal: No nausea, vomiting.  +  diarrhea. Genitourinary: Negative for dysuria. Musculoskeletal: Negative for back pain. Skin: Negative for rash. Neurological: Negative for focal weakness or numbness.  ____________________________________________   PHYSICAL EXAM:  VITAL SIGNS: ED Triage Vitals  Enc Vitals Group     BP 04/15/21 0105 90/67     Pulse Rate 04/15/21 0105 66     Resp 04/15/21 0105 17     Temp 04/15/21 0105 (!) 97.5 F (36.4 C)     Temp Source 04/15/21 0105 Oral     SpO2 04/15/21 0105 100 %     Weight 04/15/21 0108 168 lb (76.2 kg)     Height 04/15/21 0108 '5\' 8"'$  (1.727 m)     Head Circumference --      Peak Flow --      Pain Score 04/15/21 0107 5     Pain Loc --      Pain Edu? --      Excl. in [place]? --  CONSTITUTIONAL: Alert and oriented and responds appropriately to questions.  Elderly.  Appears pale.  Appears uncomfortable. HEAD: Normocephalic EYES: Conjunctivae clear, pupils appear equal, EOM appear intact, pale conjunctive a ENT: normal nose; moist mucous membranes NECK: Supple, normal ROM CARD: Regular rate and rhythm; S1 and S2 appreciated; no murmurs, no clicks, no rubs, no gallops RESP: Normal chest excursion without splinting or tachypnea; breath sounds clear and equal bilaterally; no wheezes, no rhonchi, no rales, no hypoxia or respiratory distress, speaking full sentences ABD/GI: Normal bowel sounds; non-distended; soft, mildly tender to palpation diffusely without guarding or rebound, black stools on exam that are  guaiac positive BACK: The back appears normal EXT: Normal ROM in all joints; no deformity noted, no edema; no cyanosis, no calf tenderness or calf swelling SKIN: Normal color for age and race; warm; no rash on exposed skin NEURO: Moves all extremities equally, normal speech, no facial asymmetry PSYCH: The patient's mood and manner are appropriate.  ____________________________________________   LABS (all labs ordered are listed, but only abnormal results are displayed)  Labs Reviewed  BASIC METABOLIC PANEL - Abnormal; Notable for the following components:      Result Value   Potassium 5.7 (*)    CO2 14 (*)    Glucose, Bld 135 (*)    BUN 163 (*)    Creatinine, Ser 3.74 (*)    Calcium 8.6 (*)    GFR, Estimated 15 (*)    All other components within normal limits  CBC - Abnormal; Notable for the following components:   WBC 23.3 (*)    RBC 1.20 (*)    Hemoglobin 4.1 (*)    HCT 13.2 (*)    MCV 110.0 (*)    MCH 34.2 (*)    RDW 22.2 (*)    nRBC 0.6 (*)    All other components within normal limits  PROTIME-INR - Abnormal; Notable for the following components:   Prothrombin Time 19.4 (*)    INR 1.6 (*)    All other components within normal limits  LACTIC ACID, PLASMA - Abnormal; Notable for the following components:   Lactic Acid, Venous 6.7 (*)    All other components within normal limits  HEPATIC FUNCTION PANEL - Abnormal; Notable for the following components:   Total Protein 4.8 (*)    Albumin 2.8 (*)    All other components within normal limits  CBC WITH DIFFERENTIAL/PLATELET - Abnormal; Notable for the following components:   WBC 22.7 (*)    RBC 1.19 (*)    Hemoglobin 4.1 (*)    HCT 13.3 (*)    MCV 111.8 (*)    MCH 34.5 (*)    RDW 22.0 (*)    nRBC 0.7 (*)    Neutro Abs 19.5 (*)    Monocytes Absolute 1.6 (*)    Abs Immature Granulocytes 0.49 (*)    All other components within normal limits  TROPONIN I (HIGH SENSITIVITY) - Abnormal; Notable for the following  components:   Troponin I (High Sensitivity) 369 (*)    All other components within normal limits  CULTURE, BLOOD (ROUTINE X 2)  CULTURE, BLOOD (ROUTINE X 2)  URINE CULTURE  RESP PANEL BY RT-PCR (FLU A&B, COVID) ARPGX2  URINALYSIS, COMPLETE (UACMP) WITH MICROSCOPIC  BRAIN NATRIURETIC PEPTIDE  LACTIC ACID, PLASMA  DIFFERENTIAL  TYPE AND SCREEN  PREPARE RBC (CROSSMATCH)  ABO/RH  TROPONIN I (HIGH SENSITIVITY)   ____________________________________________  EKG   EKG Interpretation  Date/Time:  Saturday Apr 15 2021 01:03:46  EDT Ventricular Rate:  69 PR Interval:  174 QRS Duration: 87 QT Interval:  397 QTC Calculation: 426 R Axis:   63 Text Interpretation: Sinus rhythm Low voltage, precordial leads Minimal ST depression, anterolateral leads Confirmed by Pryor Curia [phone]) on 04/15/2021 1:33:02 AM       ____________________________________________  RADIOLOGY Jessie Foot Erhard Senske, personally viewed and evaluated these images (plain radiographs) as part of my medical decision making, as well as reviewing the written report by the radiologist.  ED MD interpretation: Chest x-ray clear.  CT of the abdomen pelvis shows left-sided pyelonephritis and cystitis.  Official radiology report(s): No results found.  ____________________________________________   PROCEDURES  Procedure(s) performed (including Critical Care):  Procedures  CRITICAL CARE Performed by: Pryor Curia   Total critical care time: 95 minutes  Critical care time was exclusive of separately billable procedures and treating other patients.  Critical care was necessary to treat or prevent imminent or life-threatening deterioration.  Critical care was time spent personally by me on the following activities: development of treatment plan with patient and/or surrogate as well as nursing, discussions with consultants, evaluation of patient's response to treatment, examination of patient, obtaining history from  patient or surrogate, ordering and performing treatments and interventions, ordering and review of laboratory studies, ordering and review of radiographic studies, pulse oximetry and re-evaluation of patient's condition.  ____________________________________________   INITIAL IMPRESSION / ASSESSMENT AND PLAN / ED COURSE  As part of my medical decision making, I reviewed the following data within the [place] History obtained from family, Nursing notes reviewed and incorporated, Labs reviewed , EKG interpreted , Radiograph reviewed , Discussed with admitting physician  and Notes from prior ED visits         Patient here with complaints of chest pain or shortness of breath.  Found to be hypotensive here.  He is extremely pale on exam with pale conjunctivae and reports black stools for the past couple of days.  He states he is on anticoagulation.  States he last took his Eliquis at 72 PM tonight.  I am concerned for upper GI bleed.  He states he has had a colonoscopy and endoscopy years ago and was found to have polyps.  Denies any known history of cirrhosis or esophageal varices.  Does state he drinks 5 ounces of vodka every night.  I am concerned that patient is likely very anemic here and will need a blood transfusion and admission.  Sepsis is also on the differential given his hypotension and chills.  Will obtain rectal temperature, labs, cultures.  Will give IV fluids.  ED PROGRESS  Patient's hemoglobin has come back at 4.1.  Normal platelets.  INR 1.6.  Given he last took his Eliquis at 41 PM tonight and he is actively having melanotic stools that are guaiac positive, will treat with Kcentra.  We will also give 4 units of emergent packed red blood cells here.  Blood pressure improving with IV fluids.  Patient also found to have a significant leukocytosis of 23,000, lactic acidosis with lactate of 6.7 and hypothermia with a rectal temp of 96.8.  Will place under Bair hugger  and give 30 mL/kg IV fluid bolus and broad-spectrum antibiotics.  Chest x-ray, noncontrast CT of the abdomen pelvis pending.  Patient has history of chronic kidney disease but is unsure of his last creatinine.  We are attempting to get records from [facility] in [facility] where he has been previously.  He does have  a potassium of 5.7 here without EKG changes.  He is on the cardiac monitor.  He is getting IV fluids for this.  His troponin is positive but I suspect this is due to demand ischemia from hypotension, GI bleed, symptomatic anemia.  He reports feeling better now that his blood pressure has improved.  We will hold nitroglycerin given his hypotension.  We will hold aspirin, heparin given his GI bleed.  His EKG is nonischemic.  Repeat troponin pending.   2:54 AM  Spoke with Dr. Marius Ditch with GI.  She will see patient in consultation in the morning.  Agrees with Protonix infusion but given no history of cirrhosis with normal LFTs and CT scan that does not show any cirrhosis, no hematemesis, octreotide not indicated at this time.  3:47 AM  Spoke with Domingo Pulse Margaretann Loveless, NP with ICU for admission.   CT scan read concerning for left-sided pyelonephritis, cystitis.  We will obtain catheterized urine specimen.  Chest x-ray clear.   I reviewed all nursing notes and pertinent previous records as available.  I have reviewed and interpreted any EKGs, lab and urine results, imaging (as available).  ____________________________________________   FINAL CLINICAL IMPRESSION(S) / ED DIAGNOSES  Final diagnoses:  Nonspecific chest pain  NSTEMI (non-ST elevated myocardial infarction) ([place])  Gastrointestinal hemorrhage with melena  Symptomatic anemia  Acute sepsis [facility])     ED Discharge Orders    None      *Please note:  Riyaz Ciak was evaluated in Emergency Department on 04/15/2021 for the symptoms described in the history of present illness. He was evaluated in the  context of the global COVID-19 pandemic, which necessitated consideration that the patient might be at risk for infection with the SARS-CoV-2 virus that causes COVID-19. Institutional protocols and algorithms that pertain to the evaluation of patients at risk for COVID-19 are in a state of rapid change based on information released by regulatory bodies including the CDC and federal and state organizations. These policies and algorithms were followed during the patient's care in the ED.  Some ED evaluations and interventions may be delayed as a result of limited staffing during and the pandemic.*   Note:  This document was prepared using Dragon voice recognition software and may include unintentional dictation errors.   Mikiah Demond, Delice Bison, DO 04/15/21 [phone]

## 2021-04-15 NOTE — Progress Notes (Signed)
Met with patient, wife and daughter bedside. Patient was emotional throughout the visit. His family bedside seems to be a great support system. He was hopefully in conversation about his health. Provided presence and support for patient and his family.

## 2021-04-15 NOTE — ED Notes (Signed)
Spoke with Dr. Leonides Schanz regarding allergies patient made me aware of, she advises to continue with current plan of care, no changes.

## 2021-04-15 NOTE — Consult Note (Addendum)
Cardiology Consultation:   Patient ID: Gabriel King MRN: [id number]; DOB: [birth year]  Admit date: 04/15/2021 Date of Consult: 04/15/2021  PCP:  System, Provider Not In   [place]  Cardiologist:  None  Advanced Practice Provider:  No care team member to display Electrophysiologist:  None [id number]    Patient Profile:   Gabriel King is a 87 y.o. male with a hx of CAD s/p 3 stents per review of EMR and followed by cardiologist in [place], paroxysmal atrial fibrillation, CHF with EF unknown, hypertension, hyperlipidemia, chronic kidney disease stage III, COPD, previous tobacco use, current alcohol use, anemia on iron supplementation, and who is being seen today for the evaluation of chest pain in the setting of GI bleed at the request of Dr. Mortimer Fries.  History of Present Illness:   Gabriel King is an 87 year old male with PMH as above.  He has history of CAD with reported 3 stents, PAF, and CHF with EF unknown.  He is reportedly followed by a cardiologist in [place].  He reports a previous history of tobacco use and current alcohol use with 5 ounces of vodka every night.  On review of EMR, most recent medications possibly include albuterol, amiodarone 200 mg daily (though records disagree on if still taking this), Eliquis 2.5 mg twice daily, ASA 81 mg daily, atorvastatin 40 mg daily, losartan 25 mg daily, metoprolol tartrate 25 mg twice daily, tamsulosin 0.4 once per day, and torsemide 10 mg daily (as well as torsemide 20 mg daily) magnesium oxide 400 mg daily.  He reports deconditioning and review of activity shows functional capacity poor at 1-4 METS.  He does not take the stairs but rides the elevator.  He reports weakness.  Over the last 2 days, he and his wife have driven up from [place] to see their grandson be recognized for his achievements with the church.  He reports that at this time he started to feel poorly.  Over the last 1 to 2 weeks, he has woken each morning  with chest pain and shortness of breath, which has been concerning to him.  The chest discomfort and shortness of breath has waxed and waned since that time.  With chest pain, he sometimes takes an extra 1/2 tablet of his metoprolol.  He also reports worsening breathing status, especially over the last 2 days. He also reports melena with dark stools, even after stopping his iron supplementation.  He stopped his iron supplement approximately 10 days ago and reports melena and loose stools for the past 5 days.  He reports history of colonoscopy.  He last took his Eliquis at 11PM the evening of 5/13.  He reports confusion, though in proved mentation when compared with yesterday.  On 04/15/2021, he presented to [facility] emergency department with report of chest tightness across his anterior chest area and into his shoulders with associated shortness of breath per ED note.  On consultation today, he initially reported shoulder discomfort, though later denied it.  Initial vitals significant for hypotension with BP 90/67 and HR 66 bpm.  Labs showed hyperkalemia with potassium 5.7, CKD with creatinine 3.74 and BUN 163, and severe anemia with hemoglobin 4.1 and hematocrit 13.2.  Leukocytosis was also noted with WBC 23.3.  Lactic acid 6.7.  Albumin 2.8.  Initial high-sensitivity troponin 369 and increasing to over 10,535 on most recent check.  Initial EKG NSR without acute ST/T changes.  Chest x-ray was without acute abnormalities.  CT of the abdomen showed left-sided pyelonephritis and  cystitis.  Since presenting to the ED, FOBT obtained and positive.  He has been treated with Kcentra and 4 units of PRBC.  Given his hypotension, he received IV fluids with improvement in pressures.  He has been started on antibiotics for leukocytosis and lactic acidosis.  IV Protonix was also initiated. GI consultation has been requested with GI following.  Given his history of CAD and heart failure, cardiology was consulted for cardiac  evaluation prior to any GI procedures.  Records have been requested from [facility] in [place] but still pending receipt.  At the time of cardiac evaluation, he denies any chest pain.  He does report confusion and weakness.  Frequent dry cough noted on exam.   Past Medical History:  Diagnosis Date  . CHF (congestive heart failure) ([place])   . CKD (chronic kidney disease)   . COPD (chronic obstructive pulmonary disease) ([place])   . Coronary artery disease   . COVID-19 01/2021  . Hyperlipidemia   . Hypertension     History reviewed. No pertinent surgical history.   Home Medications:  Prior to Admission medications   Not on File    Inpatient Medications: Scheduled Meds: . Chlorhexidine Gluconate Cloth  6 each Topical Daily  . mupirocin ointment  1 application Nasal BID  . [START ON 04/18/2021] pantoprazole  40 mg Intravenous Q12H   Continuous Infusions: . sodium chloride    . ceFEPime (MAXIPIME) IV    . pantoprozole (PROTONIX) infusion 8 mg/hr (04/15/21 1115)   PRN Meds: docusate sodium, polyethylene glycol  Allergies:    Allergies  Allergen Reactions  . Ciprofloxacin   . Colbert Ewing [Ertapenem]     Social History:   Social History   Socioeconomic History  . Marital status: Married    Spouse name: Not on file  . Number of children: Not on file  . Years of education: Not on file  . Highest education level: Not on file  Occupational History  . Not on file  Tobacco Use  . Smoking status: Former Smoker    Packs/day: 1.00    Years: 70.00    Pack years: 70.00    Types: Cigarettes  . Smokeless tobacco: Never Used  Vaping Use  . Vaping Use: Never used  Substance and Sexual Activity  . Alcohol use: Yes    Alcohol/week: 7.0 standard drinks    Types: 7 Shots of liquor per week  . Drug use: Not on file  . Sexual activity: Not on file  Other Topics Concern  . Not on file  Social History Narrative  . Not on file   Social Determinants of Health    Financial Resource Strain: Not on file  Food Insecurity: Not on file  Transportation Needs: Not on file  Physical Activity: Not on file  Stress: Not on file  Social Connections: Not on file  Intimate Partner Violence: Not on file    Family History:   History reviewed. No pertinent family history.  No known family history of coronary artery disease or heart failure ROS:  Please see the history of present illness.  He denies current chest pain chest pain but notes a history of intermittent chest pain for the last 1 to 2 weeks, as well as progressive shortness of breath, worsening over the last 2 days.  All other ROS reviewed and negative.     Physical Exam/Data:   Vitals:   04/15/21 1000 04/15/21 1100 04/15/21 1200 04/15/21 1300  BP: (!) 97/53 (!) 108/51 125/64 Marland Kitchen)  125/103  Pulse: 69 66 73 78  Resp: 17 15 (!) 25 18  Temp:   99.1 F (37.3 C)   TempSrc:   Axillary   SpO2: 100% 99% 99% 100%  Weight:      Height:        Intake/Output Summary (Last 24 hours) at 04/15/2021 1401 Last data filed at 04/15/2021 1115 Gross per 24 hour  Intake 2917.1 ml  Output 300 ml  Net 2617.1 ml   Last 3 Weights 04/15/2021 04/15/2021  Weight (lbs) 169 lb 5 oz 168 lb  Weight (kg) 76.8 kg 76.204 kg     Body mass index is 23.61 kg/m.  General: Elderly male, no acute distress HEENT: JVP ~10cm Vascular: Radial pulses 2+ bilaterally  Cardiac:  normal S1, S2; RRR; no murmur Lungs: Poor inspiratory effort, bilaterally reduced breath sounds.  Frequent dry cough noted on exam. Abd: soft, nontender, no hepatomegaly  Ext: 1+ bilateral lower extremity edema.  Right lower extremity with bandage-clean and dry. Musculoskeletal:  No deformities Skin: warm and dry  Neuro:  no focal abnormalities noted Psych:  Normal affect   EKG:  The EKG was personally reviewed and demonstrates:  NSR, 69 bpm, low voltage, poor R wave progression V1 and V2  Telemetry:  Telemetry was personally reviewed and demonstrates:   NSR with PVCs and rates 60-80s Relevant CV Studies:  Pending echo and receipt of records   Laboratory Data:  High Sensitivity Troponin:   Recent Labs  Lab 04/15/21 0111 04/15/21 0310 04/15/21 0752 04/15/21 1042  TROPONINIHS 369* 535* 6,671* 10,535*     Chemistry Recent Labs  Lab 04/15/21 0111 04/15/21 0752  NA 136 134*  K 5.7* 5.3*  CL 107 109  CO2 14* 16*  GLUCOSE 135* 122*  BUN 163* 157*  CREATININE 3.74* 3.41*  CALCIUM 8.6* 7.8*  GFRNONAA 15* 17*  ANIONGAP 15 9    Recent Labs  Lab 04/15/21 0111  PROT 4.8*  ALBUMIN 2.8*  AST 31  ALT 15  ALKPHOS 85  BILITOT 0.7   Hematology Recent Labs  Lab 04/15/21 0111 04/15/21 0752  WBC 22.7*  23.3* 19.4*  RBC 1.19*  1.20* 2.31*  HGB 4.1*  4.1* 7.4*  7.6*  HCT 13.3*  13.2* 23.4*  23.4*  MCV 111.8*  110.0* 101.3*  MCH 34.5*  34.2* 32.0  MCHC 30.8  31.1 31.6  RDW 22.0*  22.2* 18.5*  PLT 269  268 190   BNP Recent Labs  Lab 04/15/21 0111  BNP 442.8*    DDimer No results for input(s): DDIMER in the last 168 hours.   Radiology/Studies:  CT ABDOMEN PELVIS WO CONTRAST  Result Date: 04/15/2021 CLINICAL DATA:  Acute abdominal pain EXAM: CT ABDOMEN AND PELVIS WITHOUT CONTRAST TECHNIQUE: Multidetector CT imaging of the abdomen and pelvis was performed following the standard protocol without IV contrast. COMPARISON:  None. FINDINGS: Lower chest: No acute abnormality. Coronary artery calcifications. Normal size heart. No significant pericardial effusion/thickening. Hepatobiliary: Unremarkable noncontrast appearance of the hepatic parenchyma. Gallbladder is unremarkable. No biliary ductal dilation. Pancreas: Within normal limits. Spleen: Within normal limits. Adrenals/Urinary Tract: Bilateral adrenal glands are unremarkable. No hydronephrosis.  Left greater than right perinephric stranding. Left kidney is atrophic. There is a hyperdense 1.5 cm interpolar left renal lesion measuring 1.5 cm on image 31/2, likely  representing hemorrhagic cyst. Intermediate density 1.8 cm left lower pole renal lesion which is incompletely characterized on noncontrast exam. Multiple other tiny hypodense left renal lesions technically too small to accurately  characterize but most commonly represent cysts. Hypodense 3.5 cm right renal lesion with Hounsfield units consistent with a cyst. There are additional hypodense right renal lesions measuring up to 2 cm which are also favored represent cysts. Mild symmetric bladder wall thickening. Stomach/Bowel: Stomach is grossly unremarkable. Normal positioning of the duodenum/ligament of [place]. No pathologic dilation of small bowel. The appendix is not definitely visualized however there is no pericecal inflammation. Left-sided colonic diverticulosis without findings of acute diverticulitis. Vascular/Lymphatic: Stents in the bilateral iliac veins. Aortic and branch vessel atherosclerosis without aneurysmal dilation. No pathologically enlarged abdominal or pelvic lymph nodes. Reproductive: Dystrophic prostatic calcifications. Other: Subcutaneous edema involving the posterior soft tissues with some overlying skin thickening Musculoskeletal: Age indeterminate superior endplate compression deformity involving the L2 vertebral body multilevel degenerative changes spine. Avascular necrosis of the bilateral humeral heads without collapse. IMPRESSION: 1. Left greater than right perinephric stranding, nonspecific but can be seen in the setting of pyelonephritis. Correlation with urinalysis is recommended. 2. Mild symmetric bladder wall thickening at least in part related under distension, correlation with urinalysis suggested to assess for cystitis. 3. Subcutaneous edema involving the posterior soft tissues with overlying skin thickening. Correlate with physical exam. 4. Indeterminate 1.8 cm left lower pole renal lesion, incompletely characterized on noncontrast exam. Further evaluation with renal ultrasound is  recommended. 5. Left-sided colonic diverticulosis without findings of acute diverticulitis. 6. Age indeterminate superior endplate compression deformity involving the L2 vertebral body, recommend correlation with point tenderness to assess for acuity. 7. Avascular necrosis of the bilateral humeral heads without collapse. 8. Aortic atherosclerosis. Aortic Atherosclerosis (ICD10-I70.0). Electronically Signed   By: Dahlia Bailiff MD   On: 04/15/2021 02:59   US RENAL  Result Date: 04/15/2021 CLINICAL DATA:  Acute renal failure. EXAM: RENAL / URINARY TRACT ULTRASOUND COMPLETE COMPARISON:  CT of the abdomen and pelvis on 04/15/2021 FINDINGS: Right Kidney: Renal measurements: 11.3 x 6.2 x 5.8 centimeter = volume: 209.9 mL. Bilobed cyst or 2 adjacent cysts in the LOWER pole region RIGHT kidney measure 3.2 x 4.0 x 3.3 centimeters. Renal parenchyma is hyperechoic. No hydronephrosis. Left Kidney: Renal measurements: 8.3 x 5.4 x 4.6 centimeter = volume: 107.9 mL. Cyst in the midpole region of the LEFT kidney is 1.1 x 0.9 x 1.2 centimeters. Renal parenchyma is hyperechoic. No hydronephrosis. Bladder: Appears normal for degree of bladder distention. Other: Study quality is degraded by patient's condition. IMPRESSION: 1. No hydronephrosis. 2. Bilateral echogenic renal parenchyma. 3. Bilateral benign cysts. Electronically Signed   By: Nolon Nations M.D.   On: 04/15/2021 10:57   DG Chest Port 1 View  Result Date: 04/15/2021 CLINICAL DATA:  Chest pain without cough or shortness of breath EXAM: PORTABLE CHEST 1 VIEW COMPARISON:  None. FINDINGS: The heart size and mediastinal contours are within normal limits. Aortic atherosclerosis. Elevation of the right hemidiaphragm. Chronic bronchitic lung changes with multifocal scarring. No focal consolidation. No visible pleural effusion or pneumothorax. Degenerative change of the glenohumeral and acromioclavicular joints. Thoracic spondylosis. IMPRESSION: Chronic lung changes  without evidence of acute disease. Aortic Atherosclerosis (ICD10-I70.0). Electronically Signed   By: Dahlia Bailiff MD   On: 04/15/2021 01:46     Assessment and Plan:   Preoperative cardiac evaluation  -- Denies current chest pain but does report a history of 1 to 2 weeks chest pain, most notable in the morning, as well as progressive shortness of breath.  At this time, we are still waiting on records from his primary cardiologist.  Based on review of  EMR and patient report, he has history of CAD with stenting, PAF on previous anticoagulation, and heart failure with EF unknown.  Recent labs show HS Tn 10,535 and ischemic work-up recommended as below.  Exam consistent with volume overload and IV diuresis recommended as below with echo ordered. --Overall functional capacity poor at 1-4 METS, based on our initial conversation today.  Surgery specific risk low, given endoscopic procedure with EGD/colonoscopy. --Formal preoperative cardiac recommendations deferred at this time, given need for further work-up for risk factor stratification and need for optimization of patient volume status. Based on his initial presentation and HPI above, however, suspect risk of MACE high. RCRI score pending calculation after records received and further work-up performed.  --Official recommendations unable to be made at this time and pending optimization of patient and workup.   Non-STEMI Chest pain with history of CAD -- Reports history of chest pain with high-sensitivity troponin 10,535 and still cycling.  EKG without acute ST/T changes.  His story and troponin are concerning for ACS with known risk factors for coronary insufficiency. Workup and medical management complicated by renal function and current GI bleed.   Echo ordered, pending.     Need to look at EF, wall motion, and rule out acute structural changes. If EF reduced or wall motion abnormality, further ischemic work-up recommended with MPI. Ideally, we  would perform R/L heart catheterization; however, given GIB and renal function, he is a poor cath candidate at this time and unless resolution of GIB and improved renal function.  No IV heparin at this time given GI bleed.  Holding anticoagulation and antiplatelet therapy at this time given GI bleed.Given elevated HS Tn and history of PAF, IV heparin would ideally be initiated as soon as felt safe to do so from a bleeding standpoint.  Continue to cycle HS Tn until peaked, downtrending.  Continue medical management with start of BB if vitals allow and statin with recheck of lipids and liver.  No ASA at this time as above.  Of note, also considered is PE given recent car trip and sedentary lifestyle, though less likely if compliant with his anticoagulation.  Acute on chronic heart failure of unknown ejection fraction EF unknown -- Reports progressive shortness of breath and appears volume overloaded on exam s/p recent IV fluids for hypotension at presentation and 4 units PRBC.  As above, we are waiting on an echo and his previous cardiac records for further work-up and risk factor stratification.    Avoid IV fluids until EF known.    Avoid diltiazem at this time and until EF known.    IV Lasix 40 mg x 1 administered as volume up  Closely monitor his renal function, given CKD with most recent creatinine 3.74.  Further diuresis recommendations pending repeat BMET after IV Lasix.  Of note, PTA medications included torsemide both 10 mg and 20 mg.  Recommend monitor I's/O's, daily weights.    Avoid ACE/ARB/Arni at this time and given current renal function.  History of paroxysmal atrial fibrillation -- EMR shows PAF.  Anticoagulation/IV heparin on hold in the setting of GI bleed.  IV heparin should be started once felt safe to do so from a bleeding standpoint.  Currently rate controlled and NSR - SB not on a rate controlling medication.  Recommend addition of beta-blocker if HR/BP  allows.  HTN -- BP currently well controlled.  Continue to monitor with medication recommendations as above.  GI bleed -- Per IM, GI.  Recommend transfusion for  hemoglobin below 8.0.  He has already received 4 units of PRBC and initial hemoglobin at presentation decreased down to 4 and improved to 7.4 on most recent check.  Avoid antiplatelets and anticoagulation at this time and until felt safe to do so from a bleeding perspective.  Continue antibiotics per IM.  Continue IV Lasix per IM.  CKD -- Monitor BMET closely.  Avoid nephrotoxins.  Administered IV Lasix x1 with recommendation to hold off on further diuresis until repeat BMET performed.  For questions or updates, please contact [place] Please consult [email or web address] for contact info under    Signed, Arvil Chaco, PA-C  04/15/2021 2:01 PM    I have seen and examined this patient with Marrianne Mood.  Agree with above, note added to reflect my findings.  On exam, RRR, no murmurs, lungs clear, JVD, 1+ edema.  Patient presented to the hospital with chest pain, weakness, shortness of breath.  He lives in [place] and was traveling to [place] to see family.  He was found to be anemic with evidence of GI bleeding.  He Jakeira Seeman likely need endoscopy.  He does have a cardiac history, and per the patient,Has had multiple stents and potentially heart failure.  He is also in renal failure, though it is unclear whether this is acute or chronic.  We Arling Cerone obtain a transthoracic echo.  We Clennon Nasca obtain records from his primary cardiologist on Monday as well.  With his chest pain, he had a troponin of greater than 10,000.  This is likely indicative of non-STEMI, though with his anemia, cannot rule out demand.  Unfortunately with his GI bleeding, this Nasreen Goedecke need to be treated conservatively.  Fortunately, he does not have current chest discomfort.  We Jeanett Antonopoulos be able to better quantify his risk for GI procedures once we obtain further  information.  Kaliah Haddaway M. Jakelin Taussig MD 04/16/2021 6:45 AM

## 2021-04-15 NOTE — Consult Note (Addendum)
Cephas Darby, MD [address]  [place]  [place], [place] [address]  Main: [phone]  Fax: [phone] Pager: [phone]   Consultation  Referring Provider:     No ref. provider found Primary Care Physician:  System, Provider Not In Primary Gastroenterologist: Unassigned        Reason for Consultation:     Melena, symptomatic anemia  Date of Admission:  04/15/2021 Date of Consultation:  04/15/2021         HPI:   Gabriel King is a 87 y.o. male with history of CHF, CKD, carotid artery disease, hypertension, hyperlipidemia, history of A. fib on Eliquis, last dose on 04/14/2021 at 11 PM.  Patient presented to the ER with chest pain as well as shortness of breath.  He is actually visiting from [place] with his wife.  Patient was hemodynamically stable on arrival to the ER, labs were significant for hyperkalemia, severe AKI creatinine 3.74, BUN 163, elevated troponin 369, lactic acidosis 6.7, significant leukocytosis 23.3 and severe anemia hemoglobin 4.1.  We do not know the baseline.  No outside records available.  Patient reports that he has been experiencing 3 days of melena, he also admits to drinking 5 ounces of vodka daily.  He underwent CT abdomen and pelvis without contrast in the ER, showed possible pyelonephritis, no other pathology.  Patient is kept n.p.o., started on pantoprazole drip, patient received 3 units of PRBCs, patient also received Kcentra because of Eliquis use and admitted to ICU for further monitoring.  Patient did not require any pressor support.  He denied abdominal pain, nausea or vomiting.  He denies any rectal bleeding, coffee-ground emesis or hematemesis.  His vitals were stable, hemoglobin responded appropriately to 7.4, normal platelets.  He is also started on broad-spectrum antibiotics. His troponins are further elevated, from 535 to 6671 to 10535  NSAIDs: None  Antiplts/Anticoagulants/Anti thrombotics: None  GI Procedures: Several years  ago  Past Medical History:  Diagnosis Date  . CHF (congestive heart failure) ([facility])   . CKD (chronic kidney disease)   . COPD (chronic obstructive pulmonary disease) ([facility])   . Coronary artery disease   . COVID-19 01/2021  . Hyperlipidemia   . Hypertension     History reviewed. No pertinent surgical history.  Prior to Admission medications   Not on File    Current Facility-Administered Medications:  .  0.9 %  sodium chloride infusion, 10 mL/hr, Intravenous, Once, Ward, Kristen N, DO .  ceFEPIme (MAXIPIME) 2 g in sodium chloride 0.9 % 100 mL IVPB, 2 g, Intravenous, Q24H, Kasa, Kurian, MD .  Chlorhexidine Gluconate Cloth 2 % PADS 6 each, 6 each, Topical, Daily, Kasa, Kurian, MD .  docusate sodium (COLACE) capsule 100 mg, 100 mg, Oral, BID PRN, Rust-Chester, Toribio Harbour L, NP .  mupirocin ointment (BACTROBAN) 2 % 1 application, 1 application, Nasal, BID, Kasa, Kurian, MD .  pantoprazole (PROTONIX) 80 mg in sodium chloride 0.9 % 100 mL (0.8 mg/mL) infusion, 8 mg/hr, Intravenous, Continuous, Ward, Kristen N, DO, Last Rate: 10 mL/hr at 04/15/21 1115, 8 mg/hr at 04/15/21 1115 .  [START ON 04/18/2021] pantoprazole (PROTONIX) injection 40 mg, 40 mg, Intravenous, Q12H, Ward, Kristen N, DO .  polyethylene glycol (MIRALAX / GLYCOLAX) packet 17 g, 17 g, Oral, Daily PRN, Rust-Chester, Huel Cote, NP  History reviewed. No pertinent family history.   Social History   Tobacco Use  . Smoking status: Former Smoker    Packs/day: 1.00    Years: 70.00  Pack years: 70.00    Types: Cigarettes  . Smokeless tobacco: Never Used  Vaping Use  . Vaping Use: Never used  Substance Use Topics  . Alcohol use: Yes    Alcohol/week: 7.0 standard drinks    Types: 7 Shots of liquor per week    Allergies as of 04/15/2021 - Review Complete 04/15/2021  Allergen Reaction Noted  . Ciprofloxacin  04/15/2021  . Invanz [ertapenem]  04/15/2021    Review of Systems:    All systems reviewed and negative except where  noted in HPI.   Physical Exam:  Vital signs in last 24 hours: Temp:  [93.3 F (34.1 C)-99.1 F (37.3 C)] 99.1 F (37.3 C) (05/14 1200) Pulse Rate:  [59-78] 75 (05/14 1400) Resp:  [11-27] 20 (05/14 1400) BP: (90-149)/(44-103) 121/63 (05/14 1400) SpO2:  [98 %-100 %] 100 % (05/14 1400) Weight:  [76.2 kg-76.8 kg] 76.8 kg (05/14 0600) Last BM Date: 04/15/21 General:   Pleasant, cooperative in NAD Head:  Normocephalic and atraumatic. Eyes:   No icterus.   Conjunctiva pale. PERRLA. Ears:  Normal auditory acuity. Neck:  Supple; no masses or thyroidomegaly Lungs: Respirations even and unlabored. Lungs clear to auscultation bilaterally.   No wheezes, crackles, or rhonchi.  Heart:  Regular rate and rhythm;  Without murmur, clicks, rubs or gallops Abdomen:  Soft, nondistended, nontender. Normal bowel sounds. No appreciable masses or hepatomegaly.  No rebound or guarding.  Rectal:  Not performed. Msk:  Symmetrical without gross deformities.  Strength generalized weakness Extremities:  Without edema, cyanosis or clubbing. Neurologic:  Alert and oriented x3;  grossly normal neurologically. Skin:  Intact without significant lesions or rashes. Psych:  Alert and cooperative. Normal affect.  LAB RESULTS: CBC Latest Ref Rng & Units 04/15/2021 04/15/2021 04/15/2021  WBC 4.0 - 10.5 K/uL 19.4(H) - 22.7(H)  Hemoglobin 13.0 - 17.0 g/dL 7.4(L) 7.6(L) 4.1(LL)  Hematocrit 39.0 - 52.0 % 23.4(L) 23.4(L) 13.3(LL)  Platelets 150 - 400 K/uL 190 - 269    BMET BMP Latest Ref Rng & Units 04/15/2021 04/15/2021  Glucose 70 - 99 mg/dL 122(H) 135(H)  BUN 8 - 23 mg/dL 157(H) 163(H)  Creatinine 0.61 - 1.24 mg/dL 3.41(H) 3.74(H)  Sodium 135 - 145 mmol/L 134(L) 136  Potassium 3.5 - 5.1 mmol/L 5.3(H) 5.7(H)  Chloride 98 - 111 mmol/L 109 107  CO2 22 - 32 mmol/L 16(L) 14(L)  Calcium 8.9 - 10.3 mg/dL 7.8(L) 8.6(L)    LFT Hepatic Function Latest Ref Rng & Units 04/15/2021  Total Protein 6.5 - 8.1 g/dL 4.8(L)  Albumin  3.5 - 5.0 g/dL 2.8(L)  AST 15 - 41 U/L 31  ALT 0 - 44 U/L 15  Alk Phosphatase 38 - 126 U/L 85  Total Bilirubin 0.3 - 1.2 mg/dL 0.7  Bilirubin, Direct 0.0 - 0.2 mg/dL <0.1     STUDIES: CT ABDOMEN PELVIS WO CONTRAST  Result Date: 04/15/2021 CLINICAL DATA:  Acute abdominal pain EXAM: CT ABDOMEN AND PELVIS WITHOUT CONTRAST TECHNIQUE: Multidetector CT imaging of the abdomen and pelvis was performed following the standard protocol without IV contrast. COMPARISON:  None. FINDINGS: Lower chest: No acute abnormality. Coronary artery calcifications. Normal size heart. No significant pericardial effusion/thickening. Hepatobiliary: Unremarkable noncontrast appearance of the hepatic parenchyma. Gallbladder is unremarkable. No biliary ductal dilation. Pancreas: Within normal limits. Spleen: Within normal limits. Adrenals/Urinary Tract: Bilateral adrenal glands are unremarkable. No hydronephrosis.  Left greater than right perinephric stranding. Left kidney is atrophic. There is a hyperdense 1.5 cm interpolar left renal lesion measuring 1.5  cm on image 31/2, likely representing hemorrhagic cyst. Intermediate density 1.8 cm left lower pole renal lesion which is incompletely characterized on noncontrast exam. Multiple other tiny hypodense left renal lesions technically too small to accurately characterize but most commonly represent cysts. Hypodense 3.5 cm right renal lesion with Hounsfield units consistent with a cyst. There are additional hypodense right renal lesions measuring up to 2 cm which are also favored represent cysts. Mild symmetric bladder wall thickening. Stomach/Bowel: Stomach is grossly unremarkable. Normal positioning of the duodenum/ligament of [place]. No pathologic dilation of small bowel. The appendix is not definitely visualized however there is no pericecal inflammation. Left-sided colonic diverticulosis without findings of acute diverticulitis. Vascular/Lymphatic: Stents in the bilateral iliac  veins. Aortic and branch vessel atherosclerosis without aneurysmal dilation. No pathologically enlarged abdominal or pelvic lymph nodes. Reproductive: Dystrophic prostatic calcifications. Other: Subcutaneous edema involving the posterior soft tissues with some overlying skin thickening Musculoskeletal: Age indeterminate superior endplate compression deformity involving the L2 vertebral body multilevel degenerative changes spine. Avascular necrosis of the bilateral humeral heads without collapse. IMPRESSION: 1. Left greater than right perinephric stranding, nonspecific but can be seen in the setting of pyelonephritis. Correlation with urinalysis is recommended. 2. Mild symmetric bladder wall thickening at least in part related under distension, correlation with urinalysis suggested to assess for cystitis. 3. Subcutaneous edema involving the posterior soft tissues with overlying skin thickening. Correlate with physical exam. 4. Indeterminate 1.8 cm left lower pole renal lesion, incompletely characterized on noncontrast exam. Further evaluation with renal ultrasound is recommended. 5. Left-sided colonic diverticulosis without findings of acute diverticulitis. 6. Age indeterminate superior endplate compression deformity involving the L2 vertebral body, recommend correlation with point tenderness to assess for acuity. 7. Avascular necrosis of the bilateral humeral heads without collapse. 8. Aortic atherosclerosis. Aortic Atherosclerosis (ICD10-I70.0). Electronically Signed   By: Dahlia Bailiff MD   On: 04/15/2021 02:59   US RENAL  Result Date: 04/15/2021 CLINICAL DATA:  Acute renal failure. EXAM: RENAL / URINARY TRACT ULTRASOUND COMPLETE COMPARISON:  CT of the abdomen and pelvis on 04/15/2021 FINDINGS: Right Kidney: Renal measurements: 11.3 x 6.2 x 5.8 centimeter = volume: 209.9 mL. Bilobed cyst or 2 adjacent cysts in the LOWER pole region RIGHT kidney measure 3.2 x 4.0 x 3.3 centimeters. Renal parenchyma is  hyperechoic. No hydronephrosis. Left Kidney: Renal measurements: 8.3 x 5.4 x 4.6 centimeter = volume: 107.9 mL. Cyst in the midpole region of the LEFT kidney is 1.1 x 0.9 x 1.2 centimeters. Renal parenchyma is hyperechoic. No hydronephrosis. Bladder: Appears normal for degree of bladder distention. Other: Study quality is degraded by patient's condition. IMPRESSION: 1. No hydronephrosis. 2. Bilateral echogenic renal parenchyma. 3. Bilateral benign cysts. Electronically Signed   By: Nolon Nations M.D.   On: 04/15/2021 10:57   DG Chest Port 1 View  Result Date: 04/15/2021 CLINICAL DATA:  Chest pain without cough or shortness of breath EXAM: PORTABLE CHEST 1 VIEW COMPARISON:  None. FINDINGS: The heart size and mediastinal contours are within normal limits. Aortic atherosclerosis. Elevation of the right hemidiaphragm. Chronic bronchitic lung changes with multifocal scarring. No focal consolidation. No visible pleural effusion or pneumothorax. Degenerative change of the glenohumeral and acromioclavicular joints. Thoracic spondylosis. IMPRESSION: Chronic lung changes without evidence of acute disease. Aortic Atherosclerosis (ICD10-I70.0). Electronically Signed   By: Dahlia Bailiff MD   On: 04/15/2021 01:46      Impression / Plan:   Gabriel King is a 87 y.o. pleasant Caucasian male with history of coronary artery disease,  CHF, COPD, hypertension, hyperlipidemia, A. fib on Eliquis is admitted with 3 days history of melena, severe symptomatic anemia, as well as elevated troponins and significant neutrophilic leukocytosis  Melena and severe macrocytic anemia, as well as elevated BUN/creatinine ratio Concerning for upper GI bleed in setting of anticoagulation Continue pantoprazole drip Maintain 2 large-bore IVs Monitor CBC closely to keep hemoglobin above 8 given history of coronary artery disease Okay with clear liquid diet Maintenance IV fluids Currently off Eliquis, last dose on 5/13 p.m. Patient will  need endoscopic evaluation once he is stable from cardiac standpoint and off Eliquis at least for 3 days or sooner if he develops active GI bleed Check iron panel, B12 and folate levels  Elevated troponins with chest pain, significantly elevated and uptrending Likely demand ischemia or NSTEMI Recommend cardiology consultation  Significant neutrophilic leukocytosis, ?  Pyelonephritis based on the CT scan Currently on broad-spectrum antibiotics  Acute kidney injury, hyperkalemia Recommend maintenance IV fluids Creatinine slightly improved from 3.74 to 3.41 Monitor electrolytes closely Consider nephrology consult  Thank you for involving me in the care of this patient.  GI will follow along with you    LOS: 0 days   Sherri Sear, MD  04/15/2021, 2:08 PM   Note: This dictation was prepared with Dragon dictation along with smaller phrase technology. Any transcriptional errors that result from this process are unintentional.

## 2021-04-15 NOTE — Progress Notes (Signed)
CODE SEPSIS - PHARMACY COMMUNICATION  **Broad Spectrum Antibiotics should be administered within 1 hour of Sepsis diagnosis**  Time Code Sepsis Called/Page Received:  5/14 @ 0159  Antibiotics Ordered:  Cefepime   Time of 1st antibiotic administration:  Cefepime 2 gm IV X 1 @ 5/14 @ 0237   Additional action taken by pharmacy:   If necessary, Name of Provider/Nurse Contacted:     Carsynn Bethune D ,PharmD Clinical Pharmacist  04/15/2021  3:10 AM

## 2021-04-15 NOTE — ED Notes (Signed)
Bair Hugger placed on patient.

## 2021-04-15 NOTE — ED Notes (Signed)
Patient returned from CT

## 2021-04-15 NOTE — ED Notes (Signed)
Watch given to wife.

## 2021-04-15 NOTE — Progress Notes (Signed)
PHARMACY -  BRIEF ANTIBIOTIC NOTE   Pharmacy has received consult(s) for Cefepime, Vancomycin from an ED provider.  The patient's profile has been reviewed for ht/wt/allergies/indication/available labs.    One time order(s) placed for Vancomycin 1750 mg IV X 1 and Cefepime 2 gm IV X 1.   Further antibiotics/pharmacy consults should be ordered by admitting physician if indicated.                       Thank you, Zakariyah Freimark D 04/15/2021  2:11 AM

## 2021-04-15 NOTE — ED Notes (Signed)
ED Provider at bedside. 

## 2021-04-16 ENCOUNTER — Inpatient Hospital Stay: Payer: Medicare Other

## 2021-04-16 ENCOUNTER — Inpatient Hospital Stay (HOSPITAL_COMMUNITY)
Admit: 2021-04-16 | Discharge: 2021-04-16 | Disposition: A | Discharge status: Home / Self Care | Payer: Medicare Other | Attending: Physician Assistant | Admitting: Physician Assistant

## 2021-04-16 DIAGNOSIS — I361 Nonrheumatic tricuspid (valve) insufficiency: Secondary | ICD-10-CM | POA: Diagnosis not present

## 2021-04-16 DIAGNOSIS — K921 Melena: Secondary | ICD-10-CM | POA: Diagnosis not present

## 2021-04-16 DIAGNOSIS — I214 Non-ST elevation (NSTEMI) myocardial infarction: Secondary | ICD-10-CM | POA: Diagnosis not present

## 2021-04-16 DIAGNOSIS — D649 Anemia, unspecified: Secondary | ICD-10-CM | POA: Diagnosis not present

## 2021-04-16 DIAGNOSIS — R079 Chest pain, unspecified: Secondary | ICD-10-CM

## 2021-04-16 DIAGNOSIS — D62 Acute posthemorrhagic anemia: Secondary | ICD-10-CM | POA: Diagnosis not present

## 2021-04-16 LAB — CBC
HCT: 27.1 % — ABNORMAL LOW (ref 39.0–52.0)
HCT: 26.2 % — ABNORMAL LOW (ref 39.0–52.0)
Hemoglobin: 8.9 g/dL — ABNORMAL LOW (ref 13.0–17.0)
Hemoglobin: 8.5 g/dL — ABNORMAL LOW (ref 13.0–17.0)
MCH: 31.6 pg (ref 26.0–34.0)
MCH: 31.4 pg (ref 26.0–34.0)
MCHC: 32.8 g/dL (ref 30.0–36.0)
MCHC: 32.4 g/dL (ref 30.0–36.0)
MCV: 96.1 fL (ref 80.0–100.0)
MCV: 96.7 fL (ref 80.0–100.0)
Platelets: 198 10*3/uL (ref 150–400)
Platelets: 185 10*3/uL (ref 150–400)
RBC: 2.82 MIL/uL — ABNORMAL LOW (ref 4.22–5.81)
RBC: 2.71 MIL/uL — ABNORMAL LOW (ref 4.22–5.81)
RDW: 19.9 % — ABNORMAL HIGH (ref 11.5–15.5)
RDW: 21.1 % — ABNORMAL HIGH (ref 11.5–15.5)
WBC: 19.6 10*3/uL — ABNORMAL HIGH (ref 4.0–10.5)
WBC: 16.6 10*3/uL — ABNORMAL HIGH (ref 4.0–10.5)
nRBC: 2.8 % — ABNORMAL HIGH (ref 0.0–0.2)
nRBC: 0.9 % — ABNORMAL HIGH (ref 0.0–0.2)

## 2021-04-16 LAB — BPAM RBC
Blood Product Expiration Date: 202206012359
Blood Product Expiration Date: 202206022359
Blood Product Expiration Date: 202206122359
Blood Product Expiration Date: 202206122359
Blood Product Expiration Date: 202206142359
ISSUE DATE / TIME: 202205140310
ISSUE DATE / TIME: 202205140310
ISSUE DATE / TIME: 202205140350
ISSUE DATE / TIME: 202205140352
ISSUE DATE / TIME: 202205142137
Unit Type and Rh: 5100
Unit Type and Rh: 5100
Unit Type and Rh: 600
Unit Type and Rh: 600
Unit Type and Rh: 600

## 2021-04-16 LAB — MAGNESIUM: Magnesium: 2.1 mg/dL (ref 1.7–2.4)

## 2021-04-16 LAB — BASIC METABOLIC PANEL
Anion gap: 10 (ref 5–15)
BUN: 152 mg/dL — ABNORMAL HIGH (ref 8–23)
CO2: 18 mmol/L — ABNORMAL LOW (ref 22–32)
Calcium: 8.2 mg/dL — ABNORMAL LOW (ref 8.9–10.3)
Chloride: 111 mmol/L (ref 98–111)
Creatinine, Ser: 3.57 mg/dL — ABNORMAL HIGH (ref 0.61–1.24)
GFR, Estimated: 16 mL/min — ABNORMAL LOW (ref >60)
Glucose, Bld: 112 mg/dL — ABNORMAL HIGH (ref 70–99)
Potassium: 4.9 mmol/L (ref 3.5–5.1)
Sodium: 139 mmol/L (ref 135–145)

## 2021-04-16 LAB — HEMOGLOBIN A1C
Hgb A1c MFr Bld: 5.2 % (ref 4.8–5.6)
Mean Plasma Glucose: 102.54 mg/dL

## 2021-04-16 LAB — TYPE AND SCREEN
ABO/RH(D): A NEG
Antibody Screen: NEGATIVE
Unit division: 0
Unit division: 0
Unit division: 0
Unit division: 0
Unit division: 0

## 2021-04-16 LAB — ECHOCARDIOGRAM COMPLETE
AR max vel: 1.52 cm2
AV Peak grad: 7.2 mmHg
Ao pk vel: 1.34 m/s
Area-P 1/2: 5.13 cm2
Height: 71 in
S' Lateral: 3.85 cm
Weight: 2814.83 oz

## 2021-04-16 LAB — GLUCOSE, CAPILLARY
Glucose-Capillary: 101 mg/dL — ABNORMAL HIGH (ref 70–99)
Glucose-Capillary: 110 mg/dL — ABNORMAL HIGH (ref 70–99)
Glucose-Capillary: 116 mg/dL — ABNORMAL HIGH (ref 70–99)
Glucose-Capillary: 116 mg/dL — ABNORMAL HIGH (ref 70–99)
Glucose-Capillary: 141 mg/dL — ABNORMAL HIGH (ref 70–99)
Glucose-Capillary: 97 mg/dL (ref 70–99)

## 2021-04-16 LAB — URINE CULTURE: Culture: NO GROWTH

## 2021-04-16 LAB — HEMOGLOBIN AND HEMATOCRIT, BLOOD
HCT: 28.9 % — ABNORMAL LOW (ref 39.0–52.0)
Hemoglobin: 9.7 g/dL — ABNORMAL LOW (ref 13.0–17.0)

## 2021-04-16 LAB — TROPONIN I (HIGH SENSITIVITY)
Troponin I (High Sensitivity): 7737 ng/L (ref <18)
Troponin I (High Sensitivity): 7692 ng/L (ref <18)

## 2021-04-16 LAB — PHOSPHORUS: Phosphorus: 3.5 mg/dL (ref 2.5–4.6)

## 2021-04-16 MED ORDER — AMIODARONE HCL IN DEXTROSE 360-4.14 MG/200ML-% IV SOLN
60.0000 mg/h | INTRAVENOUS | Status: DC
  Administered 2021-04-16 (×2): 60 mg/h via INTRAVENOUS
  Filled 2021-04-16 (×2): qty 200

## 2021-04-16 MED ORDER — FOLIC ACID 1 MG PO TABS
1.0000 mg | ORAL_TABLET | Freq: Every day | ORAL | Status: DC
  Administered 2021-04-16 – 2021-04-24 (×8): 1 mg via ORAL
  Filled 2021-04-16 (×7): qty 1

## 2021-04-16 MED ORDER — HYDROCODONE-ACETAMINOPHEN 5-325 MG PO TABS
1.0000 | ORAL_TABLET | Freq: Four times a day (QID) | ORAL | Status: DC | PRN
  Administered 2021-04-16 – 2021-04-23 (×4): 1 via ORAL
  Filled 2021-04-16 (×5): qty 1

## 2021-04-16 MED ORDER — THIAMINE HCL 100 MG PO TABS
100.0000 mg | ORAL_TABLET | Freq: Every day | ORAL | Status: DC
  Administered 2021-04-16 – 2021-04-24 (×8): 100 mg via ORAL
  Filled 2021-04-16 (×8): qty 1

## 2021-04-16 MED ORDER — POLYETHYLENE GLYCOL 3350 17 G PO PACK
17.0000 g | PACK | Freq: Every day | ORAL | Status: DC
  Administered 2021-04-16 – 2021-04-24 (×7): 17 g via ORAL
  Filled 2021-04-16 (×7): qty 1

## 2021-04-16 MED ORDER — ASPIRIN EC 81 MG PO TBEC
81.0000 mg | DELAYED_RELEASE_TABLET | Freq: Every day | ORAL | Status: DC
  Administered 2021-04-16 – 2021-04-24 (×8): 81 mg via ORAL
  Filled 2021-04-16 (×8): qty 1

## 2021-04-16 MED ORDER — SODIUM CHLORIDE 0.9 % IV SOLN
300.0000 mg | Freq: Once | INTRAVENOUS | Status: AC
  Administered 2021-04-16: 300 mg via INTRAVENOUS
  Filled 2021-04-16: qty 15

## 2021-04-16 MED ORDER — AMIODARONE LOAD VIA INFUSION
150.0000 mg | Freq: Once | INTRAVENOUS | Status: AC
  Administered 2021-04-16: 150 mg via INTRAVENOUS
  Filled 2021-04-16: qty 83.34

## 2021-04-16 MED ORDER — ENSURE ENLIVE PO LIQD
237.0000 mL | Freq: Two times a day (BID) | ORAL | Status: DC
  Administered 2021-04-16 – 2021-04-24 (×9): 237 mL via ORAL

## 2021-04-16 MED ORDER — FUROSEMIDE 10 MG/ML IJ SOLN
40.0000 mg | Freq: Once | INTRAMUSCULAR | Status: AC
  Administered 2021-04-16: 40 mg via INTRAVENOUS
  Filled 2021-04-16: qty 4

## 2021-04-16 MED ORDER — AMIODARONE HCL IN DEXTROSE 360-4.14 MG/200ML-% IV SOLN
30.0000 mg/h | INTRAVENOUS | Status: DC
  Administered 2021-04-16 – 2021-04-17 (×3): 30 mg/h via INTRAVENOUS
  Filled 2021-04-16 (×2): qty 200

## 2021-04-16 NOTE — Progress Notes (Signed)
*  PRELIMINARY RESULTS* Echocardiogram 2D Echocardiogram has been performed.  Gabriel King 04/16/2021, 4:17 PM

## 2021-04-16 NOTE — Consult Note (Signed)
Gabriel Darby, MD [address]  [place]  [place], [place] [address]  Main: [phone]  Fax: [phone] Pager: [phone]   Subjective: No acute events overnight.  Patient is resting comfortably in bed.  He did not have any BM since admission.  He is tolerating full liquids well.  Underwent echocardiogram, report is pending.  Patient's nurse is bedside   Objective: Vital signs in last 24 hours: Vitals:   04/16/21 1000 04/16/21 1100 04/16/21 1119 04/16/21 1200  BP: 107/70 111/70 119/82 125/68  Pulse: 83 74 77 79  Resp: 17 (!) '24 14 18  '$ Temp:    97.6 F (36.4 C)  TempSrc:    Axillary  SpO2: 100% 100% 100% 100%  Weight:      Height:       Weight change: 3.596 kg  Intake/Output Summary (Last 24 hours) at 04/16/2021 1330 Last data filed at 04/16/2021 1238 Gross per 24 hour  Intake 1303.62 ml  Output 3400 ml  Net -2096.38 ml     Exam: Heart: Irregularly irregular rate and rhythm, S1S2 present or without murmur or extra heart sounds Lungs: normal and clear to auscultation Abdomen: soft, nontender, normal bowel sounds   Lab Results: CBC Latest Ref Rng & Units 04/16/2021 04/16/2021 04/16/2021  WBC 4.0 - 10.5 K/uL 16.6(H) - 19.6(H)  Hemoglobin 13.0 - 17.0 g/dL 8.5(L) 9.7(L) 8.9(L)  Hematocrit 39.0 - 52.0 % 26.2(L) 28.9(L) 27.1(L)  Platelets 150 - 400 K/uL 185 - 198    Micro Results: Recent Results (from the past 240 hour(s))  Culture, blood (Routine X 2) w Reflex to ID Panel     Status: None (Preliminary result)   Collection Time: 04/15/21  1:51 AM   Specimen: BLOOD  Result Value Ref Range Status   Specimen Description BLOOD BLOOD RIGHT FOREARM  Final   Special Requests   Final    BOTTLES DRAWN AEROBIC AND ANAEROBIC Blood Culture adequate volume   Culture   Final    NO GROWTH 1 DAY Performed at [facility], [address] [place], [place] [address]    Report Status PENDING  Incomplete  Culture, blood (Routine X 2) w Reflex to ID Panel      Status: None (Preliminary result)   Collection Time: 04/15/21  1:52 AM   Specimen: BLOOD  Result Value Ref Range Status   Specimen Description BLOOD RIGHT ANTECUBITAL  Final   Special Requests   Final    BOTTLES DRAWN AEROBIC AND ANAEROBIC Blood Culture adequate volume   Culture   Final    NO GROWTH 1 DAY Performed at [facility], [address], [place], [place] [address]    Report Status PENDING  Incomplete  Urine Culture     Status: None   Collection Time: 04/15/21  3:26 AM   Specimen: Urine, Random  Result Value Ref Range Status   Specimen Description   Final    URINE, RANDOM Performed at [facility], [address] [place], [place] [address]    Special Requests   Final    NONE Performed at [facility], [address], [place], [place] [address]    Culture   Final    NO GROWTH Performed at [facility], [place] [address], [place], [place] [address]    Report Status 04/16/2021 FINAL  Final  Resp Panel by RT-PCR (Flu A&B, Covid)     Status: None   Collection Time: 04/15/21  4:59 AM  Specimen: Nasopharyngeal(NP) swabs in vial transport medium  Result Value Ref Range Status   SARS Coronavirus 2 by RT PCR NEGATIVE NEGATIVE Final    Comment: (NOTE) SARS-CoV-2 target nucleic acids are NOT DETECTED.  The SARS-CoV-2 RNA is generally detectable in upper respiratory specimens during the acute phase of infection. The lowest concentration of SARS-CoV-2 viral copies this assay can detect is 138 copies/mL. A negative result does not preclude SARS-Cov-2 infection and should not be used as the sole basis for treatment or other patient management decisions. A negative result may occur with  improper specimen collection/handling, submission of specimen other than nasopharyngeal swab, presence of viral mutation(s) within the areas targeted by this assay, and inadequate number of viral copies(<138 copies/mL). A negative result must be combined  with clinical observations, patient history, and epidemiological information. The expected result is Negative.  Fact Sheet for Patients:  EntrepreneurPulse.com.au  Fact Sheet for Healthcare Providers:  IncredibleEmployment.be  This test is no t yet approved or cleared by the [place] FDA and  has been authorized for detection and/or diagnosis of SARS-CoV-2 by FDA under an Emergency Use Authorization (EUA). This EUA will remain  in effect (meaning this test can be used) for the duration of the COVID-19 declaration under Section 564(b)(1) of the Act, 21 U.S.C.section 360bbb-3(b)(1), unless the authorization is terminated  or revoked sooner.       Influenza A by PCR NEGATIVE NEGATIVE Final   Influenza B by PCR NEGATIVE NEGATIVE Final    Comment: (NOTE) The Xpert Xpress SARS-CoV-2/FLU/RSV plus assay is intended as an aid in the diagnosis of influenza from Nasopharyngeal swab specimens and should not be used as a sole basis for treatment. Nasal washings and aspirates are unacceptable for Xpert Xpress SARS-CoV-2/FLU/RSV testing.  Fact Sheet for Patients: EntrepreneurPulse.com.au  Fact Sheet for Healthcare Providers: IncredibleEmployment.be  This test is not yet approved or cleared by the [place] FDA and has been authorized for detection and/or diagnosis of SARS-CoV-2 by FDA under an Emergency Use Authorization (EUA). This EUA will remain in effect (meaning this test can be used) for the duration of the COVID-19 declaration under Section 564(b)(1) of the Act, 21 U.S.C. section 360bbb-3(b)(1), unless the authorization is terminated or revoked.  Performed at [facility], [address] [place], [place] [address]   MRSA PCR Screening     Status: Abnormal   Collection Time: 04/15/21  6:13 AM   Specimen: Nasopharyngeal  Result Value Ref Range Status   MRSA by PCR POSITIVE (A) NEGATIVE  Final    Comment:        The GeneXpert MRSA Assay (FDA approved for NASAL specimens only), is one component of a comprehensive MRSA colonization surveillance program. It is not intended to diagnose MRSA infection nor to guide or monitor treatment for MRSA infections. RESULT CALLED TO, READ BACK BY AND VERIFIED WITH: [facility] FURR AT [id number] 04/15/21.PMF Performed at [facility], [address] [place], [place] [address]    Studies/Results: CT ABDOMEN PELVIS WO CONTRAST  Result Date: 04/15/2021 CLINICAL DATA:  Acute abdominal pain EXAM: CT ABDOMEN AND PELVIS WITHOUT CONTRAST TECHNIQUE: Multidetector CT imaging of the abdomen and pelvis was performed following the standard protocol without IV contrast. COMPARISON:  None. FINDINGS: Lower chest: No acute abnormality. Coronary artery calcifications. Normal size heart. No significant pericardial effusion/thickening. Hepatobiliary: Unremarkable noncontrast appearance of the hepatic parenchyma. Gallbladder is unremarkable. No biliary ductal dilation. Pancreas: Within normal limits. Spleen: Within normal limits. Adrenals/Urinary Tract: Bilateral adrenal glands  are unremarkable. No hydronephrosis.  Left greater than right perinephric stranding. Left kidney is atrophic. There is a hyperdense 1.5 cm interpolar left renal lesion measuring 1.5 cm on image 31/2, likely representing hemorrhagic cyst. Intermediate density 1.8 cm left lower pole renal lesion which is incompletely characterized on noncontrast exam. Multiple other tiny hypodense left renal lesions technically too small to accurately characterize but most commonly represent cysts. Hypodense 3.5 cm right renal lesion with Hounsfield units consistent with a cyst. There are additional hypodense right renal lesions measuring up to 2 cm which are also favored represent cysts. Mild symmetric bladder wall thickening. Stomach/Bowel: Stomach is grossly unremarkable. Normal positioning of the  duodenum/ligament of Treitz. No pathologic dilation of small bowel. The appendix is not definitely visualized however there is no pericecal inflammation. Left-sided colonic diverticulosis without findings of acute diverticulitis. Vascular/Lymphatic: Stents in the bilateral iliac veins. Aortic and branch vessel atherosclerosis without aneurysmal dilation. No pathologically enlarged abdominal or pelvic lymph nodes. Reproductive: Dystrophic prostatic calcifications. Other: Subcutaneous edema involving the posterior soft tissues with some overlying skin thickening Musculoskeletal: Age indeterminate superior endplate compression deformity involving the L2 vertebral body multilevel degenerative changes spine. Avascular necrosis of the bilateral humeral heads without collapse. IMPRESSION: 1. Left greater than right perinephric stranding, nonspecific but can be seen in the setting of pyelonephritis. Correlation with urinalysis is recommended. 2. Mild symmetric bladder wall thickening at least in part related under distension, correlation with urinalysis suggested to assess for cystitis. 3. Subcutaneous edema involving the posterior soft tissues with overlying skin thickening. Correlate with physical exam. 4. Indeterminate 1.8 cm left lower pole renal lesion, incompletely characterized on noncontrast exam. Further evaluation with renal ultrasound is recommended. 5. Left-sided colonic diverticulosis without findings of acute diverticulitis. 6. Age indeterminate superior endplate compression deformity involving the L2 vertebral body, recommend correlation with point tenderness to assess for acuity. 7. Avascular necrosis of the bilateral humeral heads without collapse. 8. Aortic atherosclerosis. Aortic Atherosclerosis (ICD10-I70.0). Electronically Signed   By: Dahlia Bailiff MD   On: 04/15/2021 02:59   US RENAL  Result Date: 04/15/2021 CLINICAL DATA:  Acute renal failure. EXAM: RENAL / URINARY TRACT ULTRASOUND COMPLETE  COMPARISON:  CT of the abdomen and pelvis on 04/15/2021 FINDINGS: Right Kidney: Renal measurements: 11.3 x 6.2 x 5.8 centimeter = volume: 209.9 mL. Bilobed cyst or 2 adjacent cysts in the LOWER pole region RIGHT kidney measure 3.2 x 4.0 x 3.3 centimeters. Renal parenchyma is hyperechoic. No hydronephrosis. Left Kidney: Renal measurements: 8.3 x 5.4 x 4.6 centimeter = volume: 107.9 mL. Cyst in the midpole region of the LEFT kidney is 1.1 x 0.9 x 1.2 centimeters. Renal parenchyma is hyperechoic. No hydronephrosis. Bladder: Appears normal for degree of bladder distention. Other: Study quality is degraded by patient's condition. IMPRESSION: 1. No hydronephrosis. 2. Bilateral echogenic renal parenchyma. 3. Bilateral benign cysts. Electronically Signed   By: Nolon Nations M.D.   On: 04/15/2021 10:57   DG Chest Port 1 View  Result Date: 04/15/2021 CLINICAL DATA:  Chest pain without cough or shortness of breath EXAM: PORTABLE CHEST 1 VIEW COMPARISON:  None. FINDINGS: The heart size and mediastinal contours are within normal limits. Aortic atherosclerosis. Elevation of the right hemidiaphragm. Chronic bronchitic lung changes with multifocal scarring. No focal consolidation. No visible pleural effusion or pneumothorax. Degenerative change of the glenohumeral and acromioclavicular joints. Thoracic spondylosis. IMPRESSION: Chronic lung changes without evidence of acute disease. Aortic Atherosclerosis (ICD10-I70.0). Electronically Signed   By: Dahlia Bailiff MD   On:  04/15/2021 01:46   Medications:  I have reviewed the patient's current medications. Prior to Admission:  Medications Prior to Admission  Medication Sig Dispense Refill Last Dose  . atorvastatin (LIPITOR) 40 MG tablet Take 40 mg by mouth daily.   04/13/2021  . Cholecalciferol 50 MCG (2000 UT) CAPS Take 2,000 Units by mouth daily.   04/13/2021  . Magnesium Oxide 400 MG CAPS Take 400 mg by mouth daily.   04/13/2021  . Melatonin 10 MG TABS Take 20 mg by  mouth at bedtime.   04/13/2021  . tamsulosin (FLOMAX) 0.4 MG CAPS capsule Take 0.4 mg by mouth daily after supper.   04/13/2021  . albuterol (VENTOLIN HFA) 108 (90 Base) MCG/ACT inhaler Inhale into the lungs.   04/13/2021  . apixaban (ELIQUIS) 2.5 MG TABS tablet Take 2.5 mg by mouth 2 (two) times daily.   04/13/2021  . cyanocobalamin 1000 MCG tablet Take 1,000 mcg by mouth daily.   04/13/2021  . FEROSUL 325 (65 Fe) MG tablet Take 325 mg by mouth daily.   04/13/2021  . finasteride (PROSCAR) 5 MG tablet Take 5 mg by mouth daily.   04/13/2021  . metoprolol tartrate (LOPRESSOR) 25 MG tablet Take 25 mg by mouth 2 (two) times daily.   04/13/2021  . torsemide (DEMADEX) 20 MG tablet Take 20 mg by mouth daily.   04/13/2021   Scheduled: . aspirin EC  81 mg Oral Daily  . carvedilol  3.125 mg Oral BID WC  . Chlorhexidine Gluconate Cloth  6 each Topical Daily  . feeding supplement  237 mL Oral BID BM  . folic acid  1 mg Oral Daily  . melatonin  20 mg Oral QHS  . mupirocin ointment  1 application Nasal BID  . [START ON 04/18/2021] pantoprazole  40 mg Intravenous Q12H  . polyethylene glycol  17 g Oral Daily  . thiamine  100 mg Oral Daily   Continuous: . sodium chloride    . amiodarone 60 mg/hr (04/16/21 1238)   Followed by  . amiodarone    . ceFEPime (MAXIPIME) IV Stopped (04/15/21 2143)  . pantoprozole (PROTONIX) infusion 8 mg/hr (04/16/21 1238)   [id number] sodium, metoprolol tartrate, polyethylene glycol Anti-infectives (From admission, onward)   Start     Dose/Rate Route Frequency Ordered Stop   04/15/21 2200  ceFEPIme (MAXIPIME) 2 g in sodium chloride 0.9 % 100 mL IVPB        2 g 200 mL/hr over 30 Minutes Intravenous Every 24 hours 04/15/21 0711     04/15/21 1200  cefTRIAXone (ROCEPHIN) 1 g in sodium chloride 0.9 % 100 mL IVPB  Status:  Discontinued        1 g 200 mL/hr over 30 Minutes Intravenous Every 24 hours 04/15/21 0442 04/15/21 0446   04/15/21 0215  vancomycin (VANCOREADY) IVPB 1750  mg/350 mL        1,750 mg 175 mL/hr over 120 Minutes Intravenous  Once 04/15/21 0206 04/15/21 0516   04/15/21 0200  ceFEPIme (MAXIPIME) 2 g in sodium chloride 0.9 % 100 mL IVPB        2 g 200 mL/hr over 30 Minutes Intravenous  Once 04/15/21 0159 04/15/21 0351   04/15/21 0200  metroNIDAZOLE (FLAGYL) IVPB 500 mg        500 mg 100 mL/hr over 60 Minutes Intravenous  Once 04/15/21 0159 04/15/21 0618   04/15/21 0200  vancomycin (VANCOCIN) IVPB 1000 mg/200 mL premix  Status:  Discontinued  1,000 mg 200 mL/hr over 60 Minutes Intravenous  Once 04/15/21 0159 04/15/21 0205     Scheduled Meds: . aspirin EC  81 mg Oral Daily  . carvedilol  3.125 mg Oral BID WC  . Chlorhexidine Gluconate Cloth  6 each Topical Daily  . feeding supplement  237 mL Oral BID BM  . folic acid  1 mg Oral Daily  . melatonin  20 mg Oral QHS  . mupirocin ointment  1 application Nasal BID  . [START ON 04/18/2021] pantoprazole  40 mg Intravenous Q12H  . polyethylene glycol  17 g Oral Daily  . thiamine  100 mg Oral Daily   Continuous Infusions: . sodium chloride    . amiodarone 60 mg/hr (04/16/21 1238)   Followed by  . amiodarone    . ceFEPime (MAXIPIME) IV Stopped (04/15/21 2143)  . pantoprozole (PROTONIX) infusion 8 mg/hr (04/16/21 1238)   PRN Meds:.docusate sodium, metoprolol tartrate, polyethylene glycol   Assessment: Principal Problem:   Acute blood loss anemia Active Problems:   GIB (gastrointestinal bleeding)   Acute kidney injury superimposed on CKD (HCC)  Gabriel King is a 87 y.o. pleasant Caucasian male with history of coronary artery disease, multiple PCI's, CHF, COPD, hypertension, hyperlipidemia, A. fib on Eliquis, last dose on 5/13 PM is admitted with 3 days history of melena, severe symptomatic anemia, as well as NSTEMI and possible sepsis  Plan: Melena with severe macrocytic anemia in setting of anticoagulation Patient did not have further episodes of active GI bleed since admission Continue  pantoprazole drip Maintain 2 large-bore IVs Monitor CBC closely to keep hemoglobin above 8 given history of coronary artery disease Advance to full liquid diet today Iron deficiency as well as folic acid deficiency, ordered parenteral iron and daily folic acid Timing of EGD to be determined based on echocardiogram, no indication for urgent upper endoscopy as of today  NSTEMI: In setting of severe anemia and underlying severe coronary artery disease Heparin has not been started secondary to concern for recent episode of GI bleed Okay to start aspirin 81 mg daily Currently on amiodarone for management of A. fib Eliquis is on hold since admission 2D echo is pending, appreciate cardiology input for preop evaluation before proceeding with upper endoscopy  Acute kidney injury, not making much improvement Nonoliguric Hyperkalemia has resolved Consider nephrology consult Currently being treated for pyelonephritis  GI will continue to follow along with you, Dr. Bonna Gains will cover from tomorrow    LOS: 1 day   Mckenlee Mangham 04/16/2021, 1:30 PM

## 2021-04-16 NOTE — Consult Note (Signed)
PHARMACY CONSULT NOTE - FOLLOW UP  Pharmacy Consult for Electrolyte Monitoring and Replacement   Recent Labs: Potassium (mmol/L)  Date Value  04/16/2021 4.9   Magnesium (mg/dL)  Date Value  04/16/2021 2.1   Calcium (mg/dL)  Date Value  04/16/2021 8.2 (L)   Albumin (g/dL)  Date Value  04/15/2021 2.8 (L)   Phosphorus (mg/dL)  Date Value  04/16/2021 3.5   Sodium (mmol/L)  Date Value  04/16/2021 139   Corrected Ca 9.16  Assessment: 87 yo male with active GIB on Eliquis at home (last dose 5/13 11pm).    S/P Kcentra administration and blood transfusion.    Pharmacy has been consulted to monitor and replenish electrolytes.  Goal of Therapy:  Electrolytes wnl's  Plan:  No replenishment warranted at this time - will continue to monitor  [name] ,PharmD Clinical Pharmacist 04/16/2021 10:07 AM

## 2021-04-16 NOTE — Progress Notes (Signed)
PROGRESS NOTE    Gabriel King  [id number] DOB: 1932/08/18 DOA: 04/15/2021 PCP: System, Provider Not In  Brief Narrative: Chronically ill 87 year old male with history of CAD, multiple PCI and stents, chronic CHF, COPD, former smoker, CKD 4, paroxysmal atrial fibrillation on Eliquis, resident of [place], currently visiting [place] for his grandsons graduation/church celebration, presented to the emergency room 5/14 with chest pain, worsening dyspnea on exertion for few weeks, melena. -In the ER he was noted to have a hemoglobin of 4, creatinine of 3.7, high-sensitivity Troponin of 10,000 -Admitted to the ICU with upper GI bleed, AKI on CKD 4 and non-STEMI -Transfused 4 units of PRBC, Eliquis held -Transferred to [facility] service today 5/15   Assessment & Plan:   Suspected upper GI bleed Severe acute blood loss anemia -Transfused 4 units of PRBC yesterday, also given Kcentra to reverse anticoagulation, Eliquis on hold -Continue IV PPI, gastroenterology following, endoscopy pending cardiac clearance -Hemoglobin improved, monitor every 12  Non-STEMI -History of CAD, multiple stents and CHF -High-sensitivity troponin peaked at 10,000, cardiology following -Records requested from hospital in [place], still pending -Follow-up echocardiogram  AKI on CKD 4 -Baseline creatinine around 2.7 per chart review in [place] -Now with creatinine in the 3.5 range, likely hemodynamically mediated secondary to non-STEMI and blood loss -Continue to trend, urine output is good -No evidence of hydronephrosis  Paroxysmal atrial fibrillation -Heart rate controlled for the most part, starting Coreg today, amiodarone initiated by cardiology as well  COPD Former smoker -Nebs as needed, no wheezing at this time  Alcohol abuse -Counseled, add thiamine, monitor for withdrawal  DVT prophylaxis: SCDs Code Status: DNR Family Communication: No family at bedside, will contact spouse Disposition  Plan:  Status is: Inpatient  Remains inpatient appropriate because:Inpatient level of care appropriate due to severity of illness   Dispo: The patient is from: Home              Anticipated d/c is to: Home              Patient currently is not medically stable to d/c.   Difficult to place patient No   Consultants:   Cardiology  PCCM transfer  Gastroenterology   Procedures:   Antimicrobials:    Subjective: -Feels better today, breathing is improving  Objective: Vitals:   04/16/21 0743 04/16/21 0800 04/16/21 0900 04/16/21 1000  BP:  135/71 127/69 107/70  Pulse: (!) 114  90 83  Resp: '20 17 15 17  '$ Temp: 98 F (36.7 C)     TempSrc: Axillary     SpO2: 93% 100% 100% 100%  Weight:      Height:        Intake/Output Summary (Last 24 hours) at 04/16/2021 1042 Last data filed at 04/16/2021 0847 Gross per 24 hour  Intake 898.56 ml  Output 3325 ml  Net -2426.44 ml   Filed Weights   04/15/21 0108 04/15/21 0600 04/16/21 0500  Weight: 76.2 kg 76.8 kg 79.8 kg    Examination:  General exam: Chronically ill elderly male, laying in bed, awake alert oriented to self and place, mild memory deficits noted CVS: S1-S2, regular rate rhythm Lungs: Poor air movement bilaterally, decreased at the bases Abdomen: Soft, nontender, bowel sounds present Extremities: 1+ edema, discoloration, wound noted on the right shin of his tibia with minimal purulence Skin: As above Neuro: Moves all extremities, no localizing signs   Data Reviewed:   CBC: Recent Labs  Lab 04/15/21 0111 04/15/21 0752 04/15/21 1442 04/16/21 [id number]  04/16/21 0807  WBC 22.7*  23.3* 19.4*  --  19.6*  --   NEUTROABS 19.5* 15.6*  --   --   --   HGB 4.1*  4.1* 7.4*  7.6* 7.7* 8.9* 9.7*  HCT 13.3*  13.2* 23.4*  23.4* 23.4* 27.1* 28.9*  MCV 111.8*  110.0* 101.3*  --  96.1  --   PLT 269  268 190  --  198  --    Basic Metabolic Panel: Recent Labs  Lab 04/15/21 0111 04/15/21 0752 04/16/21 0212  NA 136  134* 139  K 5.7* 5.3* 4.9  CL 107 109 111  CO2 14* 16* 18*  GLUCOSE 135* 122* 112*  BUN 163* 157* 152*  CREATININE 3.74* 3.41* 3.57*  CALCIUM 8.6* 7.8* 8.2*  MG  --  2.1 2.1  PHOS  --  3.5 3.5   GFR: Estimated Creatinine Clearance: 15.8 mL/min (A) (by C-G formula based on SCr of 3.57 mg/dL (H)). Liver Function Tests: Recent Labs  Lab 04/15/21 0111  AST 31  ALT 15  ALKPHOS 85  BILITOT 0.7  PROT 4.8*  ALBUMIN 2.8*   No results for input(s): LIPASE, AMYLASE in the last 168 hours. No results for input(s): AMMONIA in the last 168 hours. Coagulation Profile: Recent Labs  Lab 04/15/21 0111  INR 1.6*   Cardiac Enzymes: No results for input(s): CKTOTAL, CKMB, CKMBINDEX, TROPONINI in the last 168 hours. BNP (last 3 results) No results for input(s): PROBNP in the last 8760 hours. HbA1C: Recent Labs    04/16/21 0212  HGBA1C 5.2   CBG: Recent Labs  Lab 04/15/21 1634 04/15/21 1938 04/15/21 2343 04/16/21 0347 04/16/21 0731  GLUCAP 93 91 118* 97 116*   Lipid Profile: Recent Labs    04/15/21 0326  CHOL 78  HDL 24*  LDLCALC 33  TRIG 105  CHOLHDL 3.3   Thyroid Function Tests: Recent Labs    04/15/21 0326  TSH 2.065   Anemia Panel: Recent Labs    04/15/21 1442  VITAMINB12 357  FOLATE 5.1*  FERRITIN 38  TIBC 272  IRON 39*   Urine analysis:    Component Value Date/Time   COLORURINE STRAW (A) 04/15/2021 0326   APPEARANCEUR CLEAR (A) 04/15/2021 0326   LABSPEC 1.013 04/15/2021 0326   PHURINE 5.0 04/15/2021 0326   GLUCOSEU NEGATIVE 04/15/2021 0326   HGBUR NEGATIVE 04/15/2021 0326   BILIRUBINUR NEGATIVE 04/15/2021 0326   KETONESUR NEGATIVE 04/15/2021 0326   PROTEINUR NEGATIVE 04/15/2021 0326   NITRITE NEGATIVE 04/15/2021 0326   LEUKOCYTESUR NEGATIVE 04/15/2021 0326   Sepsis Labs: '@LABRCNTIP'$ (procalcitonin:4,lacticidven:4)  ) Recent Results (from the past 240 hour(s))  Culture, blood (Routine X 2) w Reflex to ID Panel     Status: None (Preliminary  result)   Collection Time: 04/15/21  1:51 AM   Specimen: BLOOD  Result Value Ref Range Status   Specimen Description BLOOD BLOOD RIGHT FOREARM  Final   Special Requests   Final    BOTTLES DRAWN AEROBIC AND ANAEROBIC Blood Culture adequate volume   Culture   Final    NO GROWTH 1 DAY Performed at [facility], [address] [place], [place] [address]    Report Status PENDING  Incomplete  Culture, blood (Routine X 2) w Reflex to ID Panel     Status: None (Preliminary result)   Collection Time: 04/15/21  1:52 AM   Specimen: BLOOD  Result Value Ref Range Status   Specimen Description BLOOD RIGHT ANTECUBITAL  Final   Special  Requests   Final    BOTTLES DRAWN AEROBIC AND ANAEROBIC Blood Culture adequate volume   Culture   Final    NO GROWTH 1 DAY Performed at [facility], [address] [place], [place] [address]    Report Status PENDING  Incomplete  Urine Culture     Status: None   Collection Time: 04/15/21  3:26 AM   Specimen: Urine, Random  Result Value Ref Range Status   Specimen Description   Final    URINE, RANDOM Performed at [facility], [address], [place], [place] [address]    Special Requests   Final    NONE Performed at [facility], [address], [place], [place] [address]    Culture   Final    NO GROWTH Performed at [facility], [place] [address] [place], [place] [address]    Report Status 04/16/2021 FINAL  Final  Resp Panel by RT-PCR (Flu A&B, Covid)     Status: None   Collection Time: 04/15/21  4:59 AM   Specimen: Nasopharyngeal(NP) swabs in vial transport medium  Result Value Ref Range Status   SARS Coronavirus 2 by RT PCR NEGATIVE NEGATIVE Final    Comment: (NOTE) SARS-CoV-2 target nucleic acids are NOT DETECTED.  The SARS-CoV-2 RNA is generally detectable in upper respiratory specimens during the acute phase of infection. The lowest concentration of SARS-CoV-2 viral copies this assay can detect  is 138 copies/mL. A negative result does not preclude SARS-Cov-2 infection and should not be used as the sole basis for treatment or other patient management decisions. A negative result may occur with  improper specimen collection/handling, submission of specimen other than nasopharyngeal swab, presence of viral mutation(s) within the areas targeted by this assay, and inadequate number of viral copies(<138 copies/mL). A negative result must be combined with clinical observations, patient history, and epidemiological information. The expected result is Negative.  Fact Sheet for Patients:  EntrepreneurPulse.com.au  Fact Sheet for Healthcare Providers:  IncredibleEmployment.be  This test is no t yet approved or cleared by the [place] FDA and  has been authorized for detection and/or diagnosis of SARS-CoV-2 by FDA under an Emergency Use Authorization (EUA). This EUA will remain  in effect (meaning this test can be used) for the duration of the COVID-19 declaration under Section 564(b)(1) of the Act, 21 U.S.C.section 360bbb-3(b)(1), unless the authorization is terminated  or revoked sooner.       Influenza A by PCR NEGATIVE NEGATIVE Final   Influenza B by PCR NEGATIVE NEGATIVE Final    Comment: (NOTE) The Xpert Xpress SARS-CoV-2/FLU/RSV plus assay is intended as an aid in the diagnosis of influenza from Nasopharyngeal swab specimens and should not be used as a sole basis for treatment. Nasal washings and aspirates are unacceptable for Xpert Xpress SARS-CoV-2/FLU/RSV testing.  Fact Sheet for Patients: EntrepreneurPulse.com.au  Fact Sheet for Healthcare Providers: IncredibleEmployment.be  This test is not yet approved or cleared by the [place] FDA and has been authorized for detection and/or diagnosis of SARS-CoV-2 by FDA under an Emergency Use Authorization (EUA). This EUA will remain in effect  (meaning this test can be used) for the duration of the COVID-19 declaration under Section 564(b)(1) of the Act, 21 U.S.C. section 360bbb-3(b)(1), unless the authorization is terminated or revoked.  Performed at [facility], [address] [place], [place] [address]   MRSA PCR Screening     Status: Abnormal   Collection Time: 04/15/21  6:13 AM  Specimen: Nasopharyngeal  Result Value Ref Range Status   MRSA by PCR POSITIVE (A) NEGATIVE Final    Comment:        The GeneXpert MRSA Assay (FDA approved for NASAL specimens only), is one component of a comprehensive MRSA colonization surveillance program. It is not intended to diagnose MRSA infection nor to guide or monitor treatment for MRSA infections. RESULT CALLED TO, READ BACK BY AND VERIFIED WITH: [facility] FURR AT [id number] 04/15/21.PMF Performed at [facility], [address] [place], [place] [address]          Radiology Studies: CT ABDOMEN PELVIS WO CONTRAST  Result Date: 04/15/2021 CLINICAL DATA:  Acute abdominal pain EXAM: CT ABDOMEN AND PELVIS WITHOUT CONTRAST TECHNIQUE: Multidetector CT imaging of the abdomen and pelvis was performed following the standard protocol without IV contrast. COMPARISON:  None. FINDINGS: Lower chest: No acute abnormality. Coronary artery calcifications. Normal size heart. No significant pericardial effusion/thickening. Hepatobiliary: Unremarkable noncontrast appearance of the hepatic parenchyma. Gallbladder is unremarkable. No biliary ductal dilation. Pancreas: Within normal limits. Spleen: Within normal limits. Adrenals/Urinary Tract: Bilateral adrenal glands are unremarkable. No hydronephrosis.  Left greater than right perinephric stranding. Left kidney is atrophic. There is a hyperdense 1.5 cm interpolar left renal lesion measuring 1.5 cm on image 31/2, likely representing hemorrhagic cyst. Intermediate density 1.8 cm left lower pole renal lesion which is incompletely  characterized on noncontrast exam. Multiple other tiny hypodense left renal lesions technically too small to accurately characterize but most commonly represent cysts. Hypodense 3.5 cm right renal lesion with Hounsfield units consistent with a cyst. There are additional hypodense right renal lesions measuring up to 2 cm which are also favored represent cysts. Mild symmetric bladder wall thickening. Stomach/Bowel: Stomach is grossly unremarkable. Normal positioning of the duodenum/ligament of Treitz. No pathologic dilation of small bowel. The appendix is not definitely visualized however there is no pericecal inflammation. Left-sided colonic diverticulosis without findings of acute diverticulitis. Vascular/Lymphatic: Stents in the bilateral iliac veins. Aortic and branch vessel atherosclerosis without aneurysmal dilation. No pathologically enlarged abdominal or pelvic lymph nodes. Reproductive: Dystrophic prostatic calcifications. Other: Subcutaneous edema involving the posterior soft tissues with some overlying skin thickening Musculoskeletal: Age indeterminate superior endplate compression deformity involving the L2 vertebral body multilevel degenerative changes spine. Avascular necrosis of the bilateral humeral heads without collapse. IMPRESSION: 1. Left greater than right perinephric stranding, nonspecific but can be seen in the setting of pyelonephritis. Correlation with urinalysis is recommended. 2. Mild symmetric bladder wall thickening at least in part related under distension, correlation with urinalysis suggested to assess for cystitis. 3. Subcutaneous edema involving the posterior soft tissues with overlying skin thickening. Correlate with physical exam. 4. Indeterminate 1.8 cm left lower pole renal lesion, incompletely characterized on noncontrast exam. Further evaluation with renal ultrasound is recommended. 5. Left-sided colonic diverticulosis without findings of acute diverticulitis. 6. Age  indeterminate superior endplate compression deformity involving the L2 vertebral body, recommend correlation with point tenderness to assess for acuity. 7. Avascular necrosis of the bilateral humeral heads without collapse. 8. Aortic atherosclerosis. Aortic Atherosclerosis (ICD10-I70.0). Electronically Signed   By: Dahlia Bailiff MD   On: 04/15/2021 02:59   US RENAL  Result Date: 04/15/2021 CLINICAL DATA:  Acute renal failure. EXAM: RENAL / URINARY TRACT ULTRASOUND COMPLETE COMPARISON:  CT of the abdomen and pelvis on 04/15/2021 FINDINGS: Right Kidney: Renal measurements: 11.3 x 6.2 x 5.8 centimeter = volume: 209.9 mL. Bilobed cyst or 2 adjacent cysts in the LOWER pole region RIGHT kidney measure  3.2 x 4.0 x 3.3 centimeters. Renal parenchyma is hyperechoic. No hydronephrosis. Left Kidney: Renal measurements: 8.3 x 5.4 x 4.6 centimeter = volume: 107.9 mL. Cyst in the midpole region of the LEFT kidney is 1.1 x 0.9 x 1.2 centimeters. Renal parenchyma is hyperechoic. No hydronephrosis. Bladder: Appears normal for degree of bladder distention. Other: Study quality is degraded by patient's condition. IMPRESSION: 1. No hydronephrosis. 2. Bilateral echogenic renal parenchyma. 3. Bilateral benign cysts. Electronically Signed   By: Nolon Nations M.D.   On: 04/15/2021 10:57   DG Chest Port 1 View  Result Date: 04/15/2021 CLINICAL DATA:  Chest pain without cough or shortness of breath EXAM: PORTABLE CHEST 1 VIEW COMPARISON:  None. FINDINGS: The heart size and mediastinal contours are within normal limits. Aortic atherosclerosis. Elevation of the right hemidiaphragm. Chronic bronchitic lung changes with multifocal scarring. No focal consolidation. No visible pleural effusion or pneumothorax. Degenerative change of the glenohumeral and acromioclavicular joints. Thoracic spondylosis. IMPRESSION: Chronic lung changes without evidence of acute disease. Aortic Atherosclerosis (ICD10-I70.0). Electronically Signed   By:  Dahlia Bailiff MD   On: 04/15/2021 01:46   Scheduled Meds: . amiodarone  150 mg Intravenous Once  . aspirin EC  81 mg Oral Daily  . carvedilol  3.125 mg Oral BID WC  . Chlorhexidine Gluconate Cloth  6 each Topical Daily  . feeding supplement  237 mL Oral BID BM  . folic acid  1 mg Oral Daily  . melatonin  20 mg Oral QHS  . mupirocin ointment  1 application Nasal BID  . [START ON 04/18/2021] pantoprazole  40 mg Intravenous Q12H   Continuous Infusions: . sodium chloride    . amiodarone     Followed by  . amiodarone    . ceFEPime (MAXIPIME) IV Stopped (04/15/21 2143)  . iron sucrose    . pantoprozole (PROTONIX) infusion 8 mg/hr (04/16/21 0932)     LOS: 1 day    Time spent: 39mn  PDomenic Polite MD [facility]  04/16/2021, 10:42 AM

## 2021-04-16 NOTE — Progress Notes (Addendum)
Progress Note  Patient Name: Gabriel King Date of Encounter: 04/16/2021  Primary Cardiologist:[place], [place] cardiologist  Subjective   No CP or SOB.  No tachypalpitations or dizziness.  Went into Afib overnight.  Denies any associated sx.  Bilateral LE appear cyanotic but warm to the touch with consideration of warming blanket.  Patient denies any associated pain, paresthesias, or temperature changes.  Message sent to IM to make aware.  He has not had his echo this morning, and given his current A. Fib, Davier Tramell start IV amiodarone as below.  Inpatient Medications    Scheduled Meds: . aspirin EC  81 mg Oral Daily  . carvedilol  3.125 mg Oral BID WC  . Chlorhexidine Gluconate Cloth  6 each Topical Daily  . feeding supplement  237 mL Oral BID BM  . folic acid  1 mg Oral Daily  . melatonin  20 mg Oral QHS  . mupirocin ointment  1 application Nasal BID  . [START ON 04/18/2021] pantoprazole  40 mg Intravenous Q12H   Continuous Infusions: . sodium chloride    . ceFEPime (MAXIPIME) IV Stopped (04/15/21 2143)  . iron sucrose    . pantoprozole (PROTONIX) infusion 8 mg/hr (04/16/21 0932)   PRN Meds: docusate sodium, metoprolol tartrate, polyethylene glycol   Vital Signs    Vitals:   04/16/21 0700 04/16/21 0743 04/16/21 0800 04/16/21 0900  BP: 122/70  135/71 127/69  Pulse: 74 (!) 114  90  Resp: (!) '26 20 17 15  '$ Temp:  98 F (36.7 C)    TempSrc:  Axillary    SpO2: 96% 93% 100% 100%  Weight:      Height:        Intake/Output Summary (Last 24 hours) at 04/16/2021 1010 Last data filed at 04/16/2021 0847 Gross per 24 hour  Intake 898.56 ml  Output 3325 ml  Net -2426.44 ml   Last 3 Weights 04/16/2021 04/15/2021 04/15/2021  Weight (lbs) 175 lb 14.8 oz 169 lb 5 oz 168 lb  Weight (kg) 79.8 kg 76.8 kg 76.204 kg      Telemetry    Afib with PVCs starting last night at 22:51, ventricular rates 60-90s with rare increase to 114 - Personally Reviewed  ECG    No new tracings  - Personally Reviewed  Physical Exam   GEN: No acute distress.   Neck: No JVD Cardiac: IRIR with controlled ventricular rate, no murmurs, rubs, or gallops.  Respiratory: Bibasilar crackles with anterior auscultation only given currently having an IV placed. GI: Soft, nontender, non-distended  MS: No bilateral pitting edema but cyanosis of both lower extremities noted that is new from yesterday's exam.  Lower extremities feel warm to the touch with consideration of warming blanket; No deformity. Neuro:  Nonfocal  Psych: Normal affect   Labs    High Sensitivity Troponin:   Recent Labs  Lab 04/15/21 0111 04/15/21 0310 04/15/21 0752 04/15/21 1042  TROPONINIHS 369* 535* 6,671* 10,535*      Chemistry Recent Labs  Lab 04/15/21 0111 04/15/21 0752 04/16/21 0212  NA 136 134* 139  K 5.7* 5.3* 4.9  CL 107 109 111  CO2 14* 16* 18*  GLUCOSE 135* 122* 112*  BUN 163* 157* 152*  CREATININE 3.74* 3.41* 3.57*  CALCIUM 8.6* 7.8* 8.2*  PROT 4.8*  --   --   ALBUMIN 2.8*  --   --   AST 31  --   --   ALT 15  --   --   [facility]  85  --   --   BILITOT 0.7  --   --   GFRNONAA 15* 17* 16*  ANIONGAP '15 9 10     '$ Hematology Recent Labs  Lab 04/15/21 0111 04/15/21 0752 04/15/21 1442 04/16/21 0212 04/16/21 0807  WBC 22.7*  23.3* 19.4*  --  19.6*  --   RBC 1.19*  1.20* 2.31*  --  2.82*  --   HGB 4.1*  4.1* 7.4*  7.6* 7.7* 8.9* 9.7*  HCT 13.3*  13.2* 23.4*  23.4* 23.4* 27.1* 28.9*  MCV 111.8*  110.0* 101.3*  --  96.1  --   MCH 34.5*  34.2* 32.0  --  31.6  --   MCHC 30.8  31.1 31.6  --  32.8  --   RDW 22.0*  22.2* 18.5*  --  19.9*  --   PLT 269  268 190  --  198  --     BNP Recent Labs  Lab 04/15/21 0111 04/15/21 1442  BNP 442.8* 650.6*     DDimer No results for input(s): DDIMER in the last 168 hours.   Radiology    CT ABDOMEN PELVIS WO CONTRAST  Result Date: 04/15/2021 CLINICAL DATA:  Acute abdominal pain EXAM: CT ABDOMEN AND PELVIS WITHOUT CONTRAST TECHNIQUE:  Multidetector CT imaging of the abdomen and pelvis was performed following the standard protocol without IV contrast. COMPARISON:  None. FINDINGS: Lower chest: No acute abnormality. Coronary artery calcifications. Normal size heart. No significant pericardial effusion/thickening. Hepatobiliary: Unremarkable noncontrast appearance of the hepatic parenchyma. Gallbladder is unremarkable. No biliary ductal dilation. Pancreas: Within normal limits. Spleen: Within normal limits. Adrenals/Urinary Tract: Bilateral adrenal glands are unremarkable. No hydronephrosis.  Left greater than right perinephric stranding. Left kidney is atrophic. There is a hyperdense 1.5 cm interpolar left renal lesion measuring 1.5 cm on image 31/2, likely representing hemorrhagic cyst. Intermediate density 1.8 cm left lower pole renal lesion which is incompletely characterized on noncontrast exam. Multiple other tiny hypodense left renal lesions technically too small to accurately characterize but most commonly represent cysts. Hypodense 3.5 cm right renal lesion with Hounsfield units consistent with a cyst. There are additional hypodense right renal lesions measuring up to 2 cm which are also favored represent cysts. Mild symmetric bladder wall thickening. Stomach/Bowel: Stomach is grossly unremarkable. Normal positioning of the duodenum/ligament of Treitz. No pathologic dilation of small bowel. The appendix is not definitely visualized however there is no pericecal inflammation. Left-sided colonic diverticulosis without findings of acute diverticulitis. Vascular/Lymphatic: Stents in the bilateral iliac veins. Aortic and branch vessel atherosclerosis without aneurysmal dilation. No pathologically enlarged abdominal or pelvic lymph nodes. Reproductive: Dystrophic prostatic calcifications. Other: Subcutaneous edema involving the posterior soft tissues with some overlying skin thickening Musculoskeletal: Age indeterminate superior endplate  compression deformity involving the L2 vertebral body multilevel degenerative changes spine. Avascular necrosis of the bilateral humeral heads without collapse. IMPRESSION: 1. Left greater than right perinephric stranding, nonspecific but can be seen in the setting of pyelonephritis. Correlation with urinalysis is recommended. 2. Mild symmetric bladder wall thickening at least in part related under distension, correlation with urinalysis suggested to assess for cystitis. 3. Subcutaneous edema involving the posterior soft tissues with overlying skin thickening. Correlate with physical exam. 4. Indeterminate 1.8 cm left lower pole renal lesion, incompletely characterized on noncontrast exam. Further evaluation with renal ultrasound is recommended. 5. Left-sided colonic diverticulosis without findings of acute diverticulitis. 6. Age indeterminate superior endplate compression deformity involving the L2 vertebral body, recommend correlation with point tenderness to  assess for acuity. 7. Avascular necrosis of the bilateral humeral heads without collapse. 8. Aortic atherosclerosis. Aortic Atherosclerosis (ICD10-I70.0). Electronically Signed   By: Dahlia Bailiff MD   On: 04/15/2021 02:59   US RENAL  Result Date: 04/15/2021 CLINICAL DATA:  Acute renal failure. EXAM: RENAL / URINARY TRACT ULTRASOUND COMPLETE COMPARISON:  CT of the abdomen and pelvis on 04/15/2021 FINDINGS: Right Kidney: Renal measurements: 11.3 x 6.2 x 5.8 centimeter = volume: 209.9 mL. Bilobed cyst or 2 adjacent cysts in the LOWER pole region RIGHT kidney measure 3.2 x 4.0 x 3.3 centimeters. Renal parenchyma is hyperechoic. No hydronephrosis. Left Kidney: Renal measurements: 8.3 x 5.4 x 4.6 centimeter = volume: 107.9 mL. Cyst in the midpole region of the LEFT kidney is 1.1 x 0.9 x 1.2 centimeters. Renal parenchyma is hyperechoic. No hydronephrosis. Bladder: Appears normal for degree of bladder distention. Other: Study quality is degraded by patient's  condition. IMPRESSION: 1. No hydronephrosis. 2. Bilateral echogenic renal parenchyma. 3. Bilateral benign cysts. Electronically Signed   By: Nolon Nations M.D.   On: 04/15/2021 10:57   DG Chest Port 1 View  Result Date: 04/15/2021 CLINICAL DATA:  Chest pain without cough or shortness of breath EXAM: PORTABLE CHEST 1 VIEW COMPARISON:  None. FINDINGS: The heart size and mediastinal contours are within normal limits. Aortic atherosclerosis. Elevation of the right hemidiaphragm. Chronic bronchitic lung changes with multifocal scarring. No focal consolidation. No visible pleural effusion or pneumothorax. Degenerative change of the glenohumeral and acromioclavicular joints. Thoracic spondylosis. IMPRESSION: Chronic lung changes without evidence of acute disease. Aortic Atherosclerosis (ICD10-I70.0). Electronically Signed   By: Dahlia Bailiff MD   On: 04/15/2021 01:46    Cardiac Studies   Echo pending  Patient Profile     87 y.o. male with a hx of CAD s/p 3 stents per review of EMR and followed by cardiologist in [place], paroxysmal atrial fibrillation, CHF with EF unknown, hypertension, hyperlipidemia, chronic kidney disease stage III, COPD, previous tobacco use, current alcohol use, anemia on iron supplementation, and who is being seen today for the evaluation of chest pain in the setting of GI bleed.  Assessment & Plan    Preoperative cardiac evaluation for EGD/colonoscopy -- No current CP but reported CP and SOB leading up to admission.  History of CAD with stenting, PAF on previous anticoagulation (held at this time), and heart failure with EF unknown.  Recent labs show HS Tn 10,535. Volume up on exam.  He went into A. fib overnight as above. --Overall functional capacity poor at 1-4 METS.   --Surgery specific risk low, given endoscopic procedure. --Formal preoperative cardiac recommendations deferred at this time, given need for further work-up with echo for risk factor stratification and need  for optimization of patient volume status. Likely risk of MACE high. RCRI score pending calculation after records received and further work-up performed.      Non-STEMI Chest pain with history of CAD -- No current CP.  HS Tn 10,535 -still cycling.  EKG without acute ST/T changes. Given HPI and RF, presentation concerning for ACS though also considered is supply demand ischemia given acute blood loss anemia, volume status, AKI, and conditions as below.    Echo ordered, pending.    ? Ideally, if EF reduced or WMA, we would perform R/L heart catheterization; however, given GIB and renal function, he is a poor catheterization candidate at this time and unless resolution of GIB and improved renal function.  No IV heparin at this time given GI  bleed. ? Holding anticoagulation and antiplatelet therapy at this time given GI bleed and to be initiated as soon as felt safe to do so from a bleeding standpoint.  Ordered additional Tn to continue to cycle until peaked and downtrending.  Continue medical management as BP / Cr allow.  No ASA at this time as above. Of note, not currently on a statin with current LFTs wnl and most recent LDL 33.  Acute on chronic heart failure of unknown ejection fraction EF unknown -- SOB improved. BNP 650.6.  Echo pending.    Avoid IV fluids until EF known.    Avoid diltiazem at this time and until EF known.    IV Lasix 40 mg x 1 administered as volume up but with bump in Cr overnight but BUN improving as well as K+/hyperkalemia.  Cr 3.74  3.41  3.57. BUN 157  152.   Likely worsening renal function is multifactorial in the setting of his comorbid conditions, including acute GI bleed. Dung Prien discuss with MD, as BNP continues to trend up and crackles on exam thus suspect ongoing diuresis Caili Escalera help renal function and should continue diuresis at this time.   Recommend monitor I's/O's, daily weights.     Output -1.9L yesterday.  Wt 76.8 kg  79.8kg.   Possibly  inaccurate wt due to bed wt versus standing wt.  Avoid ACE/ARB/Arni at this time and given current renal function.  Paroxysmal atrial fibrillation --Currently in atrial fibrillation, which started overnight at approximately 22:30.  Started on IV amiodarone this morning at around 10AM. Anticoagulation/IV heparin on hold in the setting of GI bleed.  IV heparin should be started once felt safe to do so from a bleeding standpoint.  Given plan for echo today, would be ideal if the patient were in NSR to properly assess his EF.  Bilateral lower extremity color change, appear cyanotic -- He denies any associated pain or temperature change.  Lower extremities feel warm to the touch with consideration of his warming blanket.  Notified IM -I Arbadella Kimbler consider bilateral lower extremity scans.  HTN -- BP soft.  Caution with antihypertensives to avoid prerenal AKI.  GI bleed -- Per IM, GI.  Recommend transfusion for hemoglobin below 8.0.  He has already received 4 units of PRBC and initial hemoglobin at presentation decreased down to 4 and improved to 7.4 on most recent check.  Avoid antiplatelets and anticoagulation at this time and until felt safe to do so from a bleeding perspective.   Continue IV Lasix per IM.  CKD -- Monitor BMET closely.  Avoid nephrotoxins.     For questions or updates, please contact [place] Please consult [email or web address] for contact info under        Signed, Arvil Chaco, PA-C  04/16/2021, 10:10 AM      I have seen and examined this patient with Marrianne Mood.  Agree with above, note added to reflect my findings.  On exam, irregular, no murmurs, faint crackles, JVD, 1+ LE edema.  Patient is currently comfortable without chest pain or shortness of breath.  He does have evidence of mild volume overload.  We Traniyah Hallett continue with IV diuresis today.  His creatinine is stayed relatively stable with diuresis.  He is gotten quite a bit of fluid with 4 units of blood.   Fortunately his hemoglobin has also remained stable.  He Avary Pitsenbarger likely need endoscopy and possible colonoscopy, but awaiting for restratification as his echo is pending.  Holding off on anticoagulation due  to GI bleed.  Fortunately his troponin has trended that down.  He did go into atrial fibrillation and we have started amiodarone to hopefully convert him to sinus rhythm.  This Jolisa Intriago likely help with diuresis.  Delan Ksiazek M. Luisalberto Beegle MD 04/16/2021 11:48 AM

## 2021-04-16 NOTE — Progress Notes (Signed)
VSS. In a-fib with controlled rate. Amiodarone administered per orders. IV team placed 2nd PIV. Voiding adequate via urinal. Complained of unspecified discomfort and irritability due to BP cuff and oxygen monitor. Pt grimaces and moans when repositioning. Sites changed and pain medicine administered and effective. No active bleeding seen this shift. To and from [facility], passing gas but no BM this shift. Miralax administered. Family updated by RN at bedside. Pt refuses dinner and wishes to rest. Family brought RLE dressing supplies to bedside. RN consulted Thurman for suggestions or to continue these dressings. Pt wants to rest and not change dressing at this time. Refuses bath at this time as well due to fatigue/drowsiness. Will continue to monitor.

## 2021-04-17 DIAGNOSIS — D62 Acute posthemorrhagic anemia: Secondary | ICD-10-CM | POA: Diagnosis not present

## 2021-04-17 DIAGNOSIS — K921 Melena: Secondary | ICD-10-CM | POA: Diagnosis not present

## 2021-04-17 DIAGNOSIS — I214 Non-ST elevation (NSTEMI) myocardial infarction: Secondary | ICD-10-CM

## 2021-04-17 LAB — CBC
HCT: 26.4 % — ABNORMAL LOW (ref 39.0–52.0)
Hemoglobin: 8.6 g/dL — ABNORMAL LOW (ref 13.0–17.0)
MCH: 31.6 pg (ref 26.0–34.0)
MCHC: 32.6 g/dL (ref 30.0–36.0)
MCV: 97.1 fL (ref 80.0–100.0)
Platelets: 208 10*3/uL (ref 150–400)
RBC: 2.72 MIL/uL — ABNORMAL LOW (ref 4.22–5.81)
RDW: 21.1 % — ABNORMAL HIGH (ref 11.5–15.5)
WBC: 13.4 10*3/uL — ABNORMAL HIGH (ref 4.0–10.5)
nRBC: 0.7 % — ABNORMAL HIGH (ref 0.0–0.2)

## 2021-04-17 LAB — GLUCOSE, CAPILLARY
Glucose-Capillary: 120 mg/dL — ABNORMAL HIGH (ref 70–99)
Glucose-Capillary: 110 mg/dL — ABNORMAL HIGH (ref 70–99)
Glucose-Capillary: 121 mg/dL — ABNORMAL HIGH (ref 70–99)
Glucose-Capillary: 130 mg/dL — ABNORMAL HIGH (ref 70–99)
Glucose-Capillary: 129 mg/dL — ABNORMAL HIGH (ref 70–99)
Glucose-Capillary: 121 mg/dL — ABNORMAL HIGH (ref 70–99)

## 2021-04-17 LAB — BASIC METABOLIC PANEL
Anion gap: 7 (ref 5–15)
BUN: 132 mg/dL — ABNORMAL HIGH (ref 8–23)
CO2: 20 mmol/L — ABNORMAL LOW (ref 22–32)
Calcium: 8.1 mg/dL — ABNORMAL LOW (ref 8.9–10.3)
Chloride: 111 mmol/L (ref 98–111)
Creatinine, Ser: 3.39 mg/dL — ABNORMAL HIGH (ref 0.61–1.24)
GFR, Estimated: 17 mL/min — ABNORMAL LOW (ref >60)
Glucose, Bld: 124 mg/dL — ABNORMAL HIGH (ref 70–99)
Potassium: 4.7 mmol/L (ref 3.5–5.1)
Sodium: 138 mmol/L (ref 135–145)

## 2021-04-17 MED ORDER — HYDROCOD POLST-CPM POLST ER 10-8 MG/5ML PO SUER
5.0000 mL | Freq: Two times a day (BID) | ORAL | Status: DC | PRN
Start: 2021-04-17 — End: 2021-04-24
  Administered 2021-04-17 – 2021-04-23 (×4): 5 mL via ORAL
  Filled 2021-04-17 (×5): qty 5

## 2021-04-17 MED ORDER — IPRATROPIUM-ALBUTEROL 0.5-2.5 (3) MG/3ML IN SOLN
3.0000 mL | RESPIRATORY_TRACT | Status: DC | PRN
  Administered 2021-04-17 – 2021-04-23 (×2): 3 mL via RESPIRATORY_TRACT
  Filled 2021-04-17 (×2): qty 3

## 2021-04-17 MED ORDER — ATORVASTATIN CALCIUM 20 MG PO TABS
40.0000 mg | ORAL_TABLET | Freq: Every day | ORAL | Status: DC
  Administered 2021-04-17 – 2021-04-24 (×7): 40 mg via ORAL
  Filled 2021-04-17 (×7): qty 2

## 2021-04-17 MED ORDER — FUROSEMIDE 10 MG/ML IJ SOLN
60.0000 mg | Freq: Once | INTRAMUSCULAR | Status: AC
  Administered 2021-04-17: 60 mg via INTRAVENOUS
  Filled 2021-04-17: qty 6

## 2021-04-17 MED ORDER — AMIODARONE HCL 200 MG PO TABS
400.0000 mg | ORAL_TABLET | Freq: Two times a day (BID) | ORAL | Status: DC
  Administered 2021-04-17 – 2021-04-24 (×11): 400 mg via ORAL
  Filled 2021-04-17 (×13): qty 2

## 2021-04-17 MED ORDER — METOPROLOL TARTRATE 25 MG PO TABS
12.5000 mg | ORAL_TABLET | Freq: Two times a day (BID) | ORAL | Status: DC
  Administered 2021-04-19 – 2021-04-23 (×6): 12.5 mg via ORAL
  Filled 2021-04-17 (×11): qty 1

## 2021-04-17 NOTE — Progress Notes (Signed)
Progress Note  Patient Name: Gabriel King Date of Encounter: 04/17/2021  Primary Cardiologist: Tamar[place] sob this AM. Also, just generally uncomfortable in bed.  Denies chest pain.  Notes that he always experienced dyspnea in setting of afib in the past.  Inpatient Medications    Scheduled Meds: . aspirin EC  81 mg Oral Daily  . carvedilol  3.125 mg Oral BID WC  . Chlorhexidine Gluconate Cloth  6 each Topical Daily  . feeding supplement  237 mL Oral BID BM  . folic acid  1 mg Oral Daily  . melatonin  20 mg Oral QHS  . mupirocin ointment  1 application Nasal BID  . [START ON 04/18/2021] pantoprazole  40 mg Intravenous Q12H  . polyethylene glycol  17 g Oral Daily  . thiamine  100 mg Oral Daily   Continuous Infusions: . sodium chloride    . amiodarone 30 mg/hr (04/16/21 2244)  . ceFEPime (MAXIPIME) IV 2 g (04/16/21 2258)  . pantoprozole (PROTONIX) infusion 8 mg/hr (04/17/21 0010)   PRN Meds: chlorpheniramine-HYDROcodone, docusate sodium, HYDROcodone-acetaminophen, metoprolol tartrate, polyethylene glycol   Vital Signs    Vitals:   04/16/21 1700 04/16/21 2000 04/17/21 0500 04/17/21 0909  BP: (!) 99/52 (!) 84/63  103/64  Pulse: 75 76  87  Resp: 16 (!) 25    Temp:  97.9 F (36.6 C)    TempSrc:  Oral    SpO2: 97% 98%    Weight:   80.3 kg   Height:        Intake/Output Summary (Last 24 hours) at 04/17/2021 0925 Last data filed at 04/17/2021 0700 Gross per 24 hour  Intake 673.06 ml  Output 2050 ml  Net -1376.94 ml   Filed Weights   04/15/21 0600 04/16/21 0500 04/17/21 0500  Weight: 76.8 kg 79.8 kg 80.3 kg    Physical Exam   GEN: Well nourished, well developed, in no acute distress.  HEENT: Grossly normal.  Neck: Supple, no JVD, carotid bruits, or masses. Cardiac: IR, IR, no murmurs, rubs, or gallops. No clubbing, cyanosis, trace bilat LE edema w/ chronic venous stasis changes.  DP/PT 1+ and equal bilaterally.  Respiratory:  Respirations  regular and unlabored, bibasilar crackles. GI: Soft, nontender, nondistended, BS + x 4. MS: no deformity or atrophy. Skin: warm and dry, no rash. Neuro:  Strength and sensation are intact. Psych: AAOx3.  Normal affect.  Labs    Chemistry Recent Labs  Lab 04/15/21 0111 04/15/21 0752 04/16/21 0212 04/17/21 0313  NA 136 134* 139 138  K 5.7* 5.3* 4.9 4.7  CL 107 109 111 111  CO2 14* 16* 18* 20*  GLUCOSE 135* 122* 112* 124*  BUN 163* 157* 152* 132*  CREATININE 3.74* 3.41* 3.57* 3.39*  CALCIUM 8.6* 7.8* 8.2* 8.1*  PROT 4.8*  --   --   --   ALBUMIN 2.8*  --   --   --   AST 31  --   --   --   ALT 15  --   --   --   ALKPHOS 85  --   --   --   BILITOT 0.7  --   --   --   GFRNONAA 15* 17* 16* 17*  ANIONGAP '15 9 10 7     '$ Hematology Recent Labs  Lab 04/16/21 0212 04/16/21 0807 04/16/21 1216 04/17/21 0313  WBC 19.6*  --  16.6* 13.4*  RBC 2.82*  --  2.71* 2.72*  HGB  8.9* 9.7* 8.5* 8.6*  HCT 27.1* 28.9* 26.2* 26.4*  MCV 96.1  --  96.7 97.1  MCH 31.6  --  31.4 31.6  MCHC 32.8  --  32.4 32.6  RDW 19.9*  --  21.1* 21.1*  PLT 198  --  185 208    Cardiac Enzymes  Recent Labs  Lab 04/15/21 0310 04/15/21 0752 04/15/21 1042 04/16/21 1023 04/16/21 1216  TROPONINIHS 535* 6,671* 10,535* 7,737* 7,692*      BNP Recent Labs  Lab 04/15/21 0111 04/15/21 1442  BNP 442.8* 650.6*    Lipids  Lab Results  Component Value Date   CHOL 78 04/15/2021   HDL 24 (L) 04/15/2021   LDLCALC 33 04/15/2021   TRIG 105 04/15/2021   CHOLHDL 3.3 04/15/2021    HbA1c  Lab Results  Component Value Date   HGBA1C 5.2 04/16/2021    Radiology    CT ABDOMEN PELVIS WO CONTRAST  Result Date: 04/15/2021 CLINICAL DATA:  Acute abdominal pain EXAM: CT ABDOMEN AND PELVIS WITHOUT CONTRAST TECHNIQUE: Multidetector CT imaging of the abdomen and pelvis was performed following the standard protocol without IV contrast. COMPARISON:  None. FINDINGS: Lower chest: No acute abnormality. Coronary artery  calcifications. Normal size heart. No significant pericardial effusion/thickening. Hepatobiliary: Unremarkable noncontrast appearance of the hepatic parenchyma. Gallbladder is unremarkable. No biliary ductal dilation. Pancreas: Within normal limits. Spleen: Within normal limits. Adrenals/Urinary Tract: Bilateral adrenal glands are unremarkable. No hydronephrosis.  Left greater than right perinephric stranding. Left kidney is atrophic. There is a hyperdense 1.5 cm interpolar left renal lesion measuring 1.5 cm on image 31/2, likely representing hemorrhagic cyst. Intermediate density 1.8 cm left lower pole renal lesion which is incompletely characterized on noncontrast exam. Multiple other tiny hypodense left renal lesions technically too small to accurately characterize but most commonly represent cysts. Hypodense 3.5 cm right renal lesion with Hounsfield units consistent with a cyst. There are additional hypodense right renal lesions measuring up to 2 cm which are also favored represent cysts. Mild symmetric bladder wall thickening. Stomach/Bowel: Stomach is grossly unremarkable. Normal positioning of the duodenum/ligament of Treitz. No pathologic dilation of small bowel. The appendix is not definitely visualized however there is no pericecal inflammation. Left-sided colonic diverticulosis without findings of acute diverticulitis. Vascular/Lymphatic: Stents in the bilateral iliac veins. Aortic and branch vessel atherosclerosis without aneurysmal dilation. No pathologically enlarged abdominal or pelvic lymph nodes. Reproductive: Dystrophic prostatic calcifications. Other: Subcutaneous edema involving the posterior soft tissues with some overlying skin thickening Musculoskeletal: Age indeterminate superior endplate compression deformity involving the L2 vertebral body multilevel degenerative changes spine. Avascular necrosis of the bilateral humeral heads without collapse. IMPRESSION: 1. Left greater than right  perinephric stranding, nonspecific but can be seen in the setting of pyelonephritis. Correlation with urinalysis is recommended. 2. Mild symmetric bladder wall thickening at least in part related under distension, correlation with urinalysis suggested to assess for cystitis. 3. Subcutaneous edema involving the posterior soft tissues with overlying skin thickening. Correlate with physical exam. 4. Indeterminate 1.8 cm left lower pole renal lesion, incompletely characterized on noncontrast exam. Further evaluation with renal ultrasound is recommended. 5. Left-sided colonic diverticulosis without findings of acute diverticulitis. 6. Age indeterminate superior endplate compression deformity involving the L2 vertebral body, recommend correlation with point tenderness to assess for acuity. 7. Avascular necrosis of the bilateral humeral heads without collapse. 8. Aortic atherosclerosis. Aortic Atherosclerosis (ICD10-I70.0). Electronically Signed   By: Dahlia Bailiff MD   On: 04/15/2021 02:59   US RENAL  Result Date:  04/15/2021 CLINICAL DATA:  Acute renal failure. EXAM: RENAL / URINARY TRACT ULTRASOUND COMPLETE COMPARISON:  CT of the abdomen and pelvis on 04/15/2021 FINDINGS: Right Kidney: Renal measurements: 11.3 x 6.2 x 5.8 centimeter = volume: 209.9 mL. Bilobed cyst or 2 adjacent cysts in the LOWER pole region RIGHT kidney measure 3.2 x 4.0 x 3.3 centimeters. Renal parenchyma is hyperechoic. No hydronephrosis. Left Kidney: Renal measurements: 8.3 x 5.4 x 4.6 centimeter = volume: 107.9 mL. Cyst in the midpole region of the LEFT kidney is 1.1 x 0.9 x 1.2 centimeters. Renal parenchyma is hyperechoic. No hydronephrosis. Bladder: Appears normal for degree of bladder distention. Other: Study quality is degraded by patient's condition. IMPRESSION: 1. No hydronephrosis. 2. Bilateral echogenic renal parenchyma. 3. Bilateral benign cysts. Electronically Signed   By: Nolon Nations M.D.   On: 04/15/2021 10:57   US ARTERIAL  ABI (SCREENING LOWER EXTREMITY)  Result Date: 04/16/2021 CLINICAL DATA:  Hypertension Right vascular ulcer EXAM: NONINVASIVE PHYSIOLOGIC VASCULAR STUDY OF BILATERAL LOWER EXTREMITIES TECHNIQUE: Evaluation of both lower extremities were performed at rest, including calculation of ankle-brachial indices with single level Doppler, pressure and pulse volume recording. COMPARISON:  None. FINDINGS: Right ABI:  Noncompressible Left ABI:  Noncompressible Right Lower Extremity: Monophasic waveform seen in dorsalis pedis. Multiphasic waveform seen in the posterior tibial. Left Lower Extremity: Monophasic waveform seen in the posterior tibial and dorsalis pedis arteries. > 1.4 Non diagnostic secondary to incompressible vessel calcifications (medial arterial sclerosis of [place]cke[place]) IMPRESSION: Nondiagnostic ABI examination of the lower extremities, likely due to diffusely calcified arteries. Electronically Signed   By: Miachel Roux M.D.   On: 04/16/2021 14:22   DG Chest Port 1 View  Result Date: 04/15/2021 CLINICAL DATA:  Chest pain without cough or shortness of breath EXAM: PORTABLE CHEST 1 VIEW COMPARISON:  None. FINDINGS: The heart size and mediastinal contours are within normal limits. Aortic atherosclerosis. Elevation of the right hemidiaphragm. Chronic bronchitic lung changes with multifocal scarring. No focal consolidation. No visible pleural effusion or pneumothorax. Degenerative change of the glenohumeral and acromioclavicular joints. Thoracic spondylosis. IMPRESSION: Chronic lung changes without evidence of acute disease. Aortic Atherosclerosis (ICD10-I70.0). Electronically Signed   By: Dahlia Bailiff MD   On: 04/15/2021 01:46   Telemetry    Afib, 70's to 80's since 7a on 5/15 - Personally Reviewed  Cardiac Studies   2D Echocardiogram 5.15.2022   1. Left ventricular ejection fraction, by estimation, is 45 to 50%. The  left ventricle has mildly decreased function. The left ventricle  demonstrates  regional wall motion abnormalities (Hypokinesis of the mid to  distal anterior and anteroseptal and apical region). There is mild left ventricular hypertrophy. Left ventricular diastolic parameters are indeterminate.   2. Right ventricular systolic function is normal. The right ventricular  size is normal. There is normal pulmonary artery systolic pressure. The  estimated right ventricular systolic pressure is 123XX123 mmHg.   3. Left atrial size was mildly dilated.   4. The inferior vena cava is dilated in size with >50% respiratory  variability, suggesting right atrial pressure of 8 mmHg.    Patient Profile     87 y.o. male with a hx of nonobs CAD,paroxysmal atrial fibrillation on eliquis,HFpEF,hypertension, hyperlipidemia, chronic kidney disease stage III-IV, COPD, previous tobacco use,current alcohol use, anemia on iron supplementation, andwho was admitted 5/14 w/ chest pain in the setting of melena and GIB w/ HGB of 4.1 and [place] 36644.  Assessment & Plan    1.  GIB/Anemia:  Admitted  5/14 w/ chest pain and a several day h/o melena. Admission H/H 4.1/13.3.  Now s/p 4u PRBCs.  H/H stable this AM @ 8.6 and 26.4.  Seen by GI w/ possible plan for EGD.  Eliquis on hold.  2.  NSTEMI:  Hs[place] to 105[address] in setting of above.  Though initial cards consult indicates that pt has a h/o CAD and stenting, further review actually shows that pt has nonobs CAD and has never undergone PCI.  Echo this admission w/ EF 45-50% w/ ant/antsept/apical HK.  Per notes, prev nl EF.  No c/p this AM, but c/o dyspnea.  Ideally, he would undergo ischemic eval following complete recovering from GIB, however w/ known CKD III-IV and creat hovering around 3, he is not a candidate for cath. Could consider stress testing for risk stratification in the future, though would likely defer to his primary cardiologist in [facility].  Cont asa (if ok w/ GI) and  blocker.  Resume home dose of atorvastatin.  3.  Acute on chronic  combined CHF:  Pt w/ h/o HFpEF, now w/ some degree of combined CHF as EF down slightly @ 45-50%. C/o dyspnea this AM w/ bibasilar crackles on exam.  He notes that dysnpea was a common Ss for him in setting of Afib.  Agree w/ IV lasix today.  Follow creat closely.  Cont  blocker as BP allows (will switch to low-dose metoprolol).  Not a candidate for acei/arb/arni/mra 2/2 CKD IV.  4.  PAF:  Long h/o PAF, though based on his description, it sounds like episodes were fairly infrequent.  He has never required DCCV and was managed w/ metoprolol and eliquis 2.5 bid only.  He has now been in rate-controlled afib since 5/15 @ ~ 7am.  He c/o dyspnea, and loss of atrial kick is likely playing a role.  Cont IV amio.  Will switch carvedilol to metoprolol since he was on this @ home and BPs soft.  In setting of #1, his eliquis is on hold and he is not a candidate for heparin, thus also not a candidate for DCCV w/o TEE first.  Defer New Post resumption until cleared by GI.    5.  CKD III-IV:  Stable.  Follow w/ diuresis today.  6. Essential HTN:  Bps soft.  7.  HL:  LDL 33 on atorva.  Cont.  Signed, Murray Hodgkins, NP  04/17/2021, 9:25 AM    For questions or updates, please contact   Please consult [email or web address] for contact info under Cardiology/STEMI.

## 2021-04-17 NOTE — Plan of Care (Signed)
Patient is alert and oriented x 4. Patient states that he is not in any pain currently. Patient has adequate urinary output. Patient has been able to release gas but still unable to have a bowel movement. I explained to the patient that this is normal. Will continue patient care.   Problem: Education: Goal: Knowledge of General Education information will improve Description: Including pain rating scale, medication(s)/side effects and non-pharmacologic comfort measures Outcome: Progressing   Problem: Health Behavior/Discharge Planning: Goal: Ability to manage health-related needs will improve Outcome: Progressing   Problem: Clinical Measurements: Goal: Ability to maintain clinical measurements within normal limits will improve Outcome: Progressing Goal: Will remain free from infection Outcome: Progressing Goal: Diagnostic test results will improve Outcome: Progressing Goal: Respiratory complications will improve Outcome: Progressing Goal: Cardiovascular complication will be avoided Outcome: Progressing   Problem: Activity: Goal: Risk for activity intolerance will decrease Outcome: Progressing   Problem: Nutrition: Goal: Adequate nutrition will be maintained Outcome: Progressing   Problem: Coping: Goal: Level of anxiety will decrease Outcome: Progressing   Problem: Elimination: Goal: Will not experience complications related to urinary retention Outcome: Progressing   Problem: Pain Managment: Goal: General experience of comfort will improve Outcome: Progressing   Problem: Safety: Goal: Ability to remain free from injury will improve Outcome: Progressing   Problem: Skin Integrity: Goal: Risk for impaired skin integrity will decrease Outcome: Progressing

## 2021-04-17 NOTE — Consult Note (Signed)
[facility] Nurse Consult Note: Reason for Consult: wound care needs Patient from [place]; followed by [facility] there Using collagen dressing; we do not carry these inpatient Wound type: full thickness ulceration RLE pretibial; partial thickness liner wound RLE lateral Pressure Injury POA: NA Measurement: RLE; pretibial; 5cm x 0.3cm x 0.2cm  RLE; lateral; 3cm x 0.1cmx 0.1cm  Wound bed: RLE pretibial; hyperkeratosis 100% RLE lateral; pink, clean  Drainage (amount, consistency, odor) none Periwound:intact; hemosiderin staining LE  Dressing procedure/placement/frequency: Clean both wounds; cover with silicone foam; change every 3 days.   Discussed POC with patient and bedside nurse.  Re consult if needed, will not follow at this time. Thanks  Lynnet Hefley [employer], RN,CWOCN, CNS, [place] [phone])

## 2021-04-17 NOTE — Progress Notes (Signed)
Pt converted to SB 45-55 rate. Cardiology made aware and verbal order to D/C amiodarone.

## 2021-04-17 NOTE — Consult Note (Incomplete)
WOC consulted to place Eakin's pouch over old drainage tube site, may need to have tube replaced per surgery notes. Discussed with bedside nurse.  Dr. Johney Maine would like to be able to quantify amount of drainage.  Bedside nurse reports only saturated gauze over 12 hours time.  Will plan to place urostomy pouch because large Shirlean Schlein is TOO large and small Eakin is not feasible.

## 2021-04-17 NOTE — Progress Notes (Signed)
PROGRESS NOTE    Gabriel King  [id number] DOB: 1932-09-30 DOA: 04/15/2021 PCP: System, Provider Not In  Brief Narrative: Chronically ill 87 year old male with history of CAD, multiple PCI and stents, chronic CHF, COPD, former smoker, CKD 4, paroxysmal atrial fibrillation on Eliquis, resident of [place], currently visiting [place] for his grandsons graduation/church celebration, presented to the emergency room 5/14 with chest pain, worsening dyspnea on exertion for few weeks, melena. -In the ER he was noted to have a hemoglobin of 4, creatinine of 3.7, high-sensitivity Troponin of 10,000 -Admitted to the ICU with upper GI bleed, AKI on CKD 4 and non-STEMI -Transfused 4 units of PRBC, Eliquis held -Transferred to [facility] service today 5/15   Assessment & Plan:   Suspected upper GI bleed Severe acute blood loss anemia -Transfused 4 units of PRBC, also given Kcentra to reverse anticoagulation, Eliquis on hold -Continue IV PPI, gastroenterology following,  -Hemoglobin improved, continue to monitor this -Ideally needs an endoscopy due to need for anticoagulation for A. fib  Non-STEMI -High-sensitivity troponin peaked at 10,000, cardiology following -Requested records from [place] again, called Dr. Marton Redwood office, d/w  His PA, they do not know of any cardiac stents, she tells me he has had a iliac stent in 04/2018.  Last echo in 1/21 noted EF of 55-to 60% with normal wall motion abnormality and severely dilated RV -2D echo here notes EF of 45% with hypokinesis of mid to distal anterior and anteroseptal apical wall  -For medical management only given severe anemia, advanced CKD -Continue aspirin, Coreg  AKI on CKD 4 -Baseline creatinine around 2.7 per cardiology office -On admission creatinine 3.5 range, likely hemodynamically mediated secondary to non-STEMI and blood loss, slight improvement, continue to monitor -Lasix x1 today, appears slightly volume overloaded -Continue  to trend, urine output is good -No evidence of hydronephrosis  Paroxysmal atrial fibrillation -Heart rate controlled for the most part, -Continue Coreg, also on amiodarone gtt. -Anticoagulation on hold in setting of severe anemia, upper GI bleed  COPD Former smoker -Nebs as needed, no wheezing at this time  Alcohol abuse -Counseled, add thiamine, monitor for withdrawal  DVT prophylaxis: SCDs Code Status: DNR Family Communication: No family at bedside, will contact spouse Disposition Plan:  Status is: Inpatient  Remains inpatient appropriate because:Inpatient level of care appropriate due to severity of illness   Dispo: The patient is from: Home              Anticipated d/c is to: Home              Patient currently is not medically stable to d/c.   Difficult to place patient No   Consultants:   Cardiology  PCCM transfer  Gastroenterology   Procedures:   Antimicrobials:    Subjective: -Feels better overall, denies any chest pain this morning, mild dyspnea  Objective: Vitals:   04/17/21 0800 04/17/21 0900 04/17/21 0909 04/17/21 1000  BP: 108/66 103/64 103/64 (!) 76/65  Pulse:  84 87 88  Resp: (!) '22 18  19  '$ Temp:  98 F (36.7 C)    TempSrc:  Axillary    SpO2:  95%  97%  Weight:      Height:        Intake/Output Summary (Last 24 hours) at 04/17/2021 1128 Last data filed at 04/17/2021 1059 Gross per 24 hour  Intake 1186.79 ml  Output 1675 ml  Net -488.21 ml   Filed Weights   04/15/21 0600 04/16/21 0500 04/17/21 0500  Weight:  76.8 kg 79.8 kg 80.3 kg    Examination:  General exam: Chronically ill elderly male, sitting up in bed, awake alert oriented to self and place, mild memory deficits noted CVS: S1-S2, regular rate rhythm Lungs: Poor air movement bilaterally, decreased at both bases Abdomen: Soft, nontender, bowel sounds present Extremities: 1+ edema, hyperpigmentation/discoloration noted and linear wound on his right tibia with minimal  purulence Skin: As above Neuro: Moves all extremities, no localizing signs   Data Reviewed:   CBC: Recent Labs  Lab 04/15/21 0111 04/15/21 0752 04/15/21 1442 04/16/21 0212 04/16/21 0807 04/16/21 1216 04/17/21 0313  WBC 22.7*  23.3* 19.4*  --  19.6*  --  16.6* 13.4*  NEUTROABS 19.5* 15.6*  --   --   --   --   --   HGB 4.1*  4.1* 7.4*  7.6* 7.7* 8.9* 9.7* 8.5* 8.6*  HCT 13.3*  13.2* 23.4*  23.4* 23.4* 27.1* 28.9* 26.2* 26.4*  MCV 111.8*  110.0* 101.3*  --  96.1  --  96.7 97.1  PLT 269  268 190  --  198  --  185 [id number]   Basic Metabolic Panel: Recent Labs  Lab 04/15/21 0111 04/15/21 0752 04/16/21 0212 04/17/21 0313  NA 136 134* 139 138  K 5.7* 5.3* 4.9 4.7  CL 107 109 111 111  CO2 14* 16* 18* 20*  GLUCOSE 135* 122* 112* 124*  BUN 163* 157* 152* 132*  CREATININE 3.74* 3.41* 3.57* 3.39*  CALCIUM 8.6* 7.8* 8.2* 8.1*  MG  --  2.1 2.1  --   PHOS  --  3.5 3.5  --    GFR: Estimated Creatinine Clearance: 16.7 mL/min (A) (by C-G formula based on SCr of 3.39 mg/dL (H)). Liver Function Tests: Recent Labs  Lab 04/15/21 0111  AST 31  ALT 15  ALKPHOS 85  BILITOT 0.7  PROT 4.8*  ALBUMIN 2.8*   No results for input(s): LIPASE, AMYLASE in the last 168 hours. No results for input(s): AMMONIA in the last 168 hours. Coagulation Profile: Recent Labs  Lab 04/15/21 0111  INR 1.6*   Cardiac Enzymes: No results for input(s): CKTOTAL, CKMB, CKMBINDEX, TROPONINI in the last 168 hours. BNP (last 3 results) No results for input(s): PROBNP in the last 8760 hours. HbA1C: Recent Labs    04/16/21 0212  HGBA1C 5.2   CBG: Recent Labs  Lab 04/16/21 1545 04/16/21 1931 04/16/21 2340 04/17/21 0335 04/17/21 0740  GLUCAP 141* 101* 110* 120* 110*   Lipid Profile: Recent Labs    04/15/21 0326  CHOL 78  HDL 24*  LDLCALC 33  TRIG 105  CHOLHDL 3.3   Thyroid Function Tests: Recent Labs    04/15/21 0326  TSH 2.065   Anemia Panel: Recent Labs    04/15/21 1442   VITAMINB12 357  FOLATE 5.1*  FERRITIN 38  TIBC 272  IRON 39*   Urine analysis:    Component Value Date/Time   COLORURINE STRAW (A) 04/15/2021 0326   APPEARANCEUR CLEAR (A) 04/15/2021 0326   LABSPEC 1.013 04/15/2021 0326   PHURINE 5.0 04/15/2021 0326   GLUCOSEU NEGATIVE 04/15/2021 0326   HGBUR NEGATIVE 04/15/2021 0326   BILIRUBINUR NEGATIVE 04/15/2021 0326   KETONESUR NEGATIVE 04/15/2021 0326   PROTEINUR NEGATIVE 04/15/2021 0326   NITRITE NEGATIVE 04/15/2021 0326   LEUKOCYTESUR NEGATIVE 04/15/2021 0326   Sepsis Labs: '@LABRCNTIP'$ (procalcitonin:4,lacticidven:4)  ) Recent Results (from the past 240 hour(s))  Culture, blood (Routine X 2) w Reflex to ID Panel     Status:  None (Preliminary result)   Collection Time: 04/15/21  1:51 AM   Specimen: BLOOD  Result Value Ref Range Status   Specimen Description BLOOD BLOOD RIGHT FOREARM  Final   Special Requests   Final    BOTTLES DRAWN AEROBIC AND ANAEROBIC Blood Culture adequate volume   Culture   Final    NO GROWTH 2 DAYS Performed at [facility], [address] [place], [place] [address]    Report Status PENDING  Incomplete  Culture, blood (Routine X 2) w Reflex to ID Panel     Status: None (Preliminary result)   Collection Time: 04/15/21  1:52 AM   Specimen: BLOOD  Result Value Ref Range Status   Specimen Description BLOOD RIGHT ANTECUBITAL  Final   Special Requests   Final    BOTTLES DRAWN AEROBIC AND ANAEROBIC Blood Culture adequate volume   Culture   Final    NO GROWTH 2 DAYS Performed at [facility], [address] [place], [place] [address]    Report Status PENDING  Incomplete  Urine Culture     Status: None   Collection Time: 04/15/21  3:26 AM   Specimen: Urine, Random  Result Value Ref Range Status   Specimen Description   Final    URINE, RANDOM Performed at [facility], [address], [place], [place] [address]    Special Requests   Final    NONE Performed at [facility], [address], [place], [place] [address]    Culture   Final    NO GROWTH Performed at [facility], [place] [address], [place], [place] [address]    Report Status 04/16/2021 FINAL  Final  Resp Panel by RT-PCR (Flu A&B, Covid)     Status: None   Collection Time: 04/15/21  4:59 AM   Specimen: Nasopharyngeal(NP) swabs in vial transport medium  Result Value Ref Range Status   SARS Coronavirus 2 by RT PCR NEGATIVE NEGATIVE Final    Comment: (NOTE) SARS-CoV-2 target nucleic acids are NOT DETECTED.  The SARS-CoV-2 RNA is generally detectable in upper respiratory specimens during the acute phase of infection. The lowest concentration of SARS-CoV-2 viral copies this assay can detect is 138 copies/mL. A negative result does not preclude SARS-Cov-2 infection and should not be used as the sole basis for treatment or other patient management decisions. A negative result may occur with  improper specimen collection/handling, submission of specimen other than nasopharyngeal swab, presence of viral mutation(s) within the areas targeted by this assay, and inadequate number of viral copies(<138 copies/mL). A negative result must be combined with clinical observations, patient history, and epidemiological information. The expected result is Negative.  Fact Sheet for Patients:  EntrepreneurPulse.com.au  Fact Sheet for Healthcare Providers:  IncredibleEmployment.be  This test is no t yet approved or cleared by the [place] FDA and  has been authorized for detection and/or diagnosis of SARS-CoV-2 by FDA under an Emergency Use Authorization (EUA). This EUA will remain  in effect (meaning this test can be used) for the duration of the COVID-19 declaration under Section 564(b)(1) of the Act, 21 U.S.C.section 360bbb-3(b)(1), unless the authorization is terminated  or revoked sooner.       Influenza A by PCR NEGATIVE NEGATIVE Final    Influenza B by PCR NEGATIVE NEGATIVE Final    Comment: (NOTE) The Xpert Xpress SARS-CoV-2/FLU/RSV plus assay is intended as an aid in the diagnosis of influenza from Nasopharyngeal swab specimens and should not be  used as a sole basis for treatment. Nasal washings and aspirates are unacceptable for Xpert Xpress SARS-CoV-2/FLU/RSV testing.  Fact Sheet for Patients: EntrepreneurPulse.com.au  Fact Sheet for Healthcare Providers: IncredibleEmployment.be  This test is not yet approved or cleared by the [place] FDA and has been authorized for detection and/or diagnosis of SARS-CoV-2 by FDA under an Emergency Use Authorization (EUA). This EUA will remain in effect (meaning this test can be used) for the duration of the COVID-19 declaration under Section 564(b)(1) of the Act, 21 U.S.C. section 360bbb-3(b)(1), unless the authorization is terminated or revoked.  Performed at [facility], [address] [place], [place] [address]   MRSA PCR Screening     Status: Abnormal   Collection Time: 04/15/21  6:13 AM   Specimen: Nasopharyngeal  Result Value Ref Range Status   MRSA by PCR POSITIVE (A) NEGATIVE Final    Comment:        The GeneXpert MRSA Assay (FDA approved for NASAL specimens only), is one component of a comprehensive MRSA colonization surveillance program. It is not intended to diagnose MRSA infection nor to guide or monitor treatment for MRSA infections. RESULT CALLED TO, READ BACK BY AND VERIFIED WITH: [facility] FURR AT [id number] 04/15/21.PMF Performed at [facility], [address] [place], [place] [address]          Radiology Studies: US ARTERIAL ABI (SCREENING LOWER EXTREMITY)  Result Date: 04/16/2021 CLINICAL DATA:  Hypertension Right vascular ulcer EXAM: NONINVASIVE PHYSIOLOGIC VASCULAR STUDY OF BILATERAL LOWER EXTREMITIES TECHNIQUE: Evaluation of both lower extremities were performed at rest, including  calculation of ankle-brachial indices with single level Doppler, pressure and pulse volume recording. COMPARISON:  None. FINDINGS: Right ABI:  Noncompressible Left ABI:  Noncompressible Right Lower Extremity: Monophasic waveform seen in dorsalis pedis. Multiphasic waveform seen in the posterior tibial. Left Lower Extremity: Monophasic waveform seen in the posterior tibial and dorsalis pedis arteries. > 1.4 Non diagnostic secondary to incompressible vessel calcifications (medial arterial sclerosis of [place]) IMPRESSION: Nondiagnostic ABI examination of the lower extremities, likely due to diffusely calcified arteries. Electronically Signed   By: Miachel Roux M.D.   On: 04/16/2021 14:22   ECHOCARDIOGRAM COMPLETE  Result Date: 04/16/2021    ECHOCARDIOGRAM REPORT   Patient Name:   Gabriel King Date of Exam: 04/16/2021 Medical Rec #:  [id number]   Height:       71.0 in Accession #:    [id number]  Weight:       175.9 lb Date of Birth:  22-Nov-1932  BSA:          1.997 m Patient Age:    29 years    BP:           125/103 mmHg Patient Gender: M           HR:           78 bpm. Exam Location:  [facility] Procedure: 2D Echo, Cardiac Doppler and Color Doppler Indications:     Chest Pain R07.9  History:         Patient has no prior history of Echocardiogram examinations.                  CHF; CAD.  Sonographer:     Alyse Low Roar Referring Phys:  [id number] Arvil Chaco Diagnosing Phys: Ida Rogue MD IMPRESSIONS  1. Left ventricular ejection fraction, by estimation, is 45 to 50%. The left ventricle has mildly decreased function. The left ventricle demonstrates regional wall motion abnormalities (Hypokinesis  of the mid to distal anterior and anteroseptal and apical region). There is mild left ventricular hypertrophy. Left ventricular diastolic parameters are indeterminate.  2. Right ventricular systolic function is normal. The right ventricular size is normal. There is normal pulmonary artery systolic pressure. The estimated  right ventricular systolic pressure is 123XX123 mmHg.  3. Left atrial size was mildly dilated.  4. The inferior vena cava is dilated in size with >50% respiratory variability, suggesting right atrial pressure of 8 mmHg. FINDINGS  Left Ventricle: Left ventricular ejection fraction, by estimation, is 45 to 50%. The left ventricle has mildly decreased function. The left ventricle demonstrates regional wall motion abnormalities. The left ventricular internal cavity size was normal in size. There is mild left ventricular hypertrophy. Left ventricular diastolic parameters are indeterminate. Right Ventricle: The right ventricular size is normal. No increase in right ventricular wall thickness. Right ventricular systolic function is normal. There is normal pulmonary artery systolic pressure. The tricuspid regurgitant velocity is 2.44 m/s, and  with an assumed right atrial pressure of 10 mmHg, the estimated right ventricular systolic pressure is 123XX123 mmHg. Left Atrium: Left atrial size was mildly dilated. Right Atrium: Right atrial size was normal in size. Pericardium: There is no evidence of pericardial effusion. Mitral Valve: The mitral valve is normal in structure. No evidence of mitral valve regurgitation. No evidence of mitral valve stenosis. Tricuspid Valve: The tricuspid valve is normal in structure. Tricuspid valve regurgitation is mild . No evidence of tricuspid stenosis. Aortic Valve: The aortic valve was not well visualized. Aortic valve regurgitation is not visualized. Mild aortic valve sclerosis is present, with no evidence of aortic valve stenosis. Aortic valve peak gradient measures 7.2 mmHg. Pulmonic Valve: The pulmonic valve was normal in structure. Pulmonic valve regurgitation is not visualized. No evidence of pulmonic stenosis. Aorta: The aortic root is normal in size and structure. Venous: The inferior vena cava is dilated in size with greater than 50% respiratory variability, suggesting right atrial pressure  of 8 mmHg. IAS/Shunts: No atrial level shunt detected by color flow Doppler.  LEFT VENTRICLE PLAX 2D LVIDd:         4.99 cm  Diastology LVIDs:         3.85 cm  LV e' medial:    7.62 cm/s LV PW:         1.25 cm  LV E/e' medial:  13.5 LV IVS:        1.21 cm  LV e' lateral:   11.60 cm/s LVOT diam:     1.70 cm  LV E/e' lateral: 8.9 LVOT Area:     2.27 cm  RIGHT VENTRICLE RV Mid diam:    3.12 cm RV S prime:     13.20 cm/s TAPSE (M-mode): 1.7 cm LEFT ATRIUM             Index       RIGHT ATRIUM           Index LA diam:        4.00 cm 2.00 cm/m  RA Area:     14.20 cm LA Vol (A2C):   75.5 ml 37.81 ml/m RA Volume:   30.30 ml  15.17 ml/m LA Vol (A4C):   61.1 ml 30.60 ml/m LA Biplane Vol: 69.6 ml 34.86 ml/m  AORTIC VALVE                PULMONIC VALVE AV Area (Vmax): 1.52 cm    PV Vmax:  0.83 m/s AV Vmax:        134.00 cm/s PV Peak grad:   2.7 mmHg AV Peak Grad:   7.2 mmHg    RVOT Peak grad: 2 mmHg LVOT Vmax:      90.00 cm/s  AORTA Ao Root diam: 2.70 cm MITRAL VALVE                TRICUSPID VALVE MV Area (PHT): 5.13 cm     TR Peak grad:   23.8 mmHg MV Decel Time: 148 msec     TR Vmax:        244.00 cm/s MV E velocity: 103.00 cm/s                             SHUNTS                             Systemic Diam: 1.70 cm Ida Rogue MD Electronically signed by Ida Rogue MD Signature Date/Time: 04/16/2021/8:22:41 PM    Final    Scheduled Meds: . aspirin EC  81 mg Oral Daily  . carvedilol  3.125 mg Oral BID WC  . Chlorhexidine Gluconate Cloth  6 each Topical Daily  . feeding supplement  237 mL Oral BID BM  . folic acid  1 mg Oral Daily  . furosemide  60 mg Intravenous Once  . melatonin  20 mg Oral QHS  . mupirocin ointment  1 application Nasal BID  . [START ON 04/18/2021] pantoprazole  40 mg Intravenous Q12H  . polyethylene glycol  17 g Oral Daily  . thiamine  100 mg Oral Daily   Continuous Infusions: . sodium chloride    . amiodarone 30 mg/hr (04/17/21 1059)  . ceFEPime (MAXIPIME) IV Stopped  (04/16/21 2334)  . pantoprozole (PROTONIX) infusion Stopped (04/17/21 0946)     LOS: 2 days    Time spent: 57mn  PDomenic Polite MD [facility]  04/17/2021, 11:28 AM

## 2021-04-17 NOTE — Progress Notes (Signed)
Gabriel Antigua, MD [address], [place], [place], [name], [address] [address], [place], [place], [name], [address] Phone: [phone]  Fax: [phone]   Subjective: Patient resting in bed comfortably with wife at bedside.  They deny any further episodes of melena.  Nurse at bedside and also confirms the same.  Flowsheets do not show any bowel movements in the last 48 hours.  No nausea or vomiting.  Denies any abdominal pain.   Objective: Exam: Vital signs in last 24 hours: Vitals:   04/17/21 1000 04/17/21 1100 04/17/21 1200 04/17/21 1300  BP: (!) 76/65 (!) 103/55 102/60 108/75  Pulse: 88 71 76 (!) 51  Resp: 19 15 (!) 26 20  Temp:    97.6 F (36.4 C)  TempSrc:    Axillary  SpO2: 97% 98% 100% 98%  Weight:      Height:       Weight change: 0.5 kg  Intake/Output Summary (Last 24 hours) at 04/17/2021 1401 Last data filed at 04/17/2021 1258 Gross per 24 hour  Intake 988.69 ml  Output 1675 ml  Net -686.31 ml    General: No acute distress, AAO x3 Abd: Soft, NT/ND, No HSM Skin: Warm, no rashes Neck: Supple, Trachea midline   Lab Results: Lab Results  Component Value Date   WBC 13.4 (H) 04/17/2021   HGB 8.6 (L) 04/17/2021   HCT 26.4 (L) 04/17/2021   MCV 97.1 04/17/2021   PLT 208 04/17/2021   Micro Results: Recent Results (from the past 240 hour(s))  Culture, blood (Routine X 2) w Reflex to ID Panel     Status: None (Preliminary result)   Collection Time: 04/15/21  1:51 AM   Specimen: BLOOD  Result Value Ref Range Status   Specimen Description BLOOD BLOOD RIGHT FOREARM  Final   Special Requests   Final    BOTTLES DRAWN AEROBIC AND ANAEROBIC Blood Culture adequate volume   Culture   Final    NO GROWTH 2 DAYS Performed at [facility], [address] [place], [place] [address]    Report Status PENDING  Incomplete  Culture, blood (Routine X 2) w Reflex to ID Panel     Status: None (Preliminary result)   Collection Time: 04/15/21  1:52 AM    Specimen: BLOOD  Result Value Ref Range Status   Specimen Description BLOOD RIGHT ANTECUBITAL  Final   Special Requests   Final    BOTTLES DRAWN AEROBIC AND ANAEROBIC Blood Culture adequate volume   Culture   Final    NO GROWTH 2 DAYS Performed at [facility], [address] [place], [place] [address]    Report Status PENDING  Incomplete  Urine Culture     Status: None   Collection Time: 04/15/21  3:26 AM   Specimen: Urine, Random  Result Value Ref Range Status   Specimen Description   Final    URINE, RANDOM Performed at [facility], [address], [place], [place] [address]    Special Requests   Final    NONE Performed at [facility], [address], [place], [place] [address]    Culture   Final    NO GROWTH Performed at [facility], [place] [address] [place], [place] [address]    Report Status 04/16/2021 FINAL  Final  Resp Panel by RT-PCR (Flu A&B, Covid)     Status: None   Collection Time: 04/15/21  4:59 AM   Specimen: Nasopharyngeal(NP) swabs in vial transport  medium  Result Value Ref Range Status   SARS Coronavirus 2 by RT PCR NEGATIVE NEGATIVE Final    Comment: (NOTE) SARS-CoV-2 target nucleic acids are NOT DETECTED.  The SARS-CoV-2 RNA is generally detectable in upper respiratory specimens during the acute phase of infection. The lowest concentration of SARS-CoV-2 viral copies this assay can detect is 138 copies/mL. A negative result does not preclude SARS-Cov-2 infection and should not be used as the sole basis for treatment or other patient management decisions. A negative result may occur with  improper specimen collection/handling, submission of specimen other than nasopharyngeal swab, presence of viral mutation(s) within the areas targeted by this assay, and inadequate number of viral copies(<138 copies/mL). A negative result must be combined with clinical observations, patient history, and  epidemiological information. The expected result is Negative.  Fact Sheet for Patients:  EntrepreneurPulse.com.au  Fact Sheet for Healthcare Providers:  IncredibleEmployment.be  This test is no t yet approved or cleared by the [place] FDA and  has been authorized for detection and/or diagnosis of SARS-CoV-2 by FDA under an Emergency Use Authorization (EUA). This EUA will remain  in effect (meaning this test can be used) for the duration of the COVID-19 declaration under Section 564(b)(1) of the Act, 21 U.S.C.section 360bbb-3(b)(1), unless the authorization is terminated  or revoked sooner.       Influenza A by PCR NEGATIVE NEGATIVE Final   Influenza B by PCR NEGATIVE NEGATIVE Final    Comment: (NOTE) The Xpert Xpress SARS-CoV-2/FLU/RSV plus assay is intended as an aid in the diagnosis of influenza from Nasopharyngeal swab specimens and should not be used as a sole basis for treatment. Nasal washings and aspirates are unacceptable for Xpert Xpress SARS-CoV-2/FLU/RSV testing.  Fact Sheet for Patients: EntrepreneurPulse.com.au  Fact Sheet for Healthcare Providers: IncredibleEmployment.be  This test is not yet approved or cleared by the [place] FDA and has been authorized for detection and/or diagnosis of SARS-CoV-2 by FDA under an Emergency Use Authorization (EUA). This EUA will remain in effect (meaning this test can be used) for the duration of the COVID-19 declaration under Section 564(b)(1) of the Act, 21 U.S.C. section 360bbb-3(b)(1), unless the authorization is terminated or revoked.  Performed at [facility], [address] [place], [place] [address]   MRSA PCR Screening     Status: Abnormal   Collection Time: 04/15/21  6:13 AM   Specimen: Nasopharyngeal  Result Value Ref Range Status   MRSA by PCR POSITIVE (A) NEGATIVE Final    Comment:        The GeneXpert MRSA Assay  (FDA approved for NASAL specimens only), is one component of a comprehensive MRSA colonization surveillance program. It is not intended to diagnose MRSA infection nor to guide or monitor treatment for MRSA infections. RESULT CALLED TO, READ BACK BY AND VERIFIED WITH: [facility] FURR AT [id number] 04/15/21.PMF Performed at [facility], [address] [place], [place] [address]    Studies/Results: US ARTERIAL ABI (SCREENING LOWER EXTREMITY)  Result Date: 04/16/2021 CLINICAL DATA:  Hypertension Right vascular ulcer EXAM: NONINVASIVE PHYSIOLOGIC VASCULAR STUDY OF BILATERAL LOWER EXTREMITIES TECHNIQUE: Evaluation of both lower extremities were performed at rest, including calculation of ankle-brachial indices with single level Doppler, pressure and pulse volume recording. COMPARISON:  None. FINDINGS: Right ABI:  Noncompressible Left ABI:  Noncompressible Right Lower Extremity: Monophasic waveform seen in dorsalis pedis. Multiphasic waveform seen in the posterior tibial. Left Lower Extremity: Monophasic waveform seen in the posterior tibial and dorsalis pedis arteries. 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 Electronically Signed   By: Miachel Roux M.D.   On: 04/16/2021 14:22   ECHOCARDIOGRAM COMPLETE  Result Date: 04/16/2021    ECHOCARDIOGRAM REPORT   Patient Name:   Gabriel King Date of Exam: 04/16/2021 Medical Rec #:  [id number]   Height:       71.0 in Accession #:    [id number]  Weight:       175.9 lb Date of Birth:  01/17/1932  BSA:          1.997 m Patient Age:    87 years    BP:           125/103 mmHg Patient Gender: M           HR:           78 bpm. Exam Location:  [facility] Procedure: 2D Echo, Cardiac Doppler and Color Doppler Indications:     Chest Pain R07.9  History:         Patient has no prior history of  Echocardiogram examinations.                  CHF; CAD.  Sonographer:     Alyse Low Roar Referring Phys:  [id number] Arvil Chaco Diagnosing Phys: Ida Rogue MD IMPRESSIONS  1. Left ventricular ejection fraction, by estimation, is 45 to 50%. The left ventricle has mildly decreased function. The left ventricle demonstrates regional wall motion abnormalities (Hypokinesis of the mid to distal anterior and anteroseptal and apical region). There is mild left ventricular hypertrophy. Left ventricular diastolic parameters are indeterminate.  2. Right ventricular systolic function is normal. The right ventricular size is normal. There is normal pulmonary artery systolic pressure. The estimated right ventricular systolic pressure is 123XX123 mmHg.  3. Left atrial size was mildly dilated.  4. The inferior vena cava is dilated in size with >50% respiratory variability, suggesting right atrial pressure of 8 mmHg. FINDINGS  Left Ventricle: Left ventricular ejection fraction, by estimation, is 45 to 50%. The left ventricle has mildly decreased function. The left ventricle demonstrates regional wall motion abnormalities. The left ventricular internal cavity size was normal in size. There is mild left ventricular hypertrophy. Left ventricular diastolic parameters are indeterminate. Right Ventricle: The right ventricular size is normal. No increase in right ventricular wall thickness. Right ventricular systolic function is normal. There is normal pulmonary artery systolic pressure. The tricuspid regurgitant velocity is 2.44 m/s, and  with an assumed right atrial pressure of 10 mmHg, the estimated right ventricular systolic pressure is 123XX123 mmHg. Left Atrium: Left atrial size was mildly dilated. Right Atrium: Right atrial size was normal in size. Pericardium: There is no evidence of pericardial effusion. Mitral Valve: The mitral valve is normal in structure. No evidence of mitral valve regurgitation. No evidence of mitral valve  stenosis. Tricuspid Valve: The tricuspid valve is normal in structure. Tricuspid valve regurgitation is mild . No evidence of tricuspid stenosis. Aortic Valve: The aortic valve was not well visualized. Aortic valve regurgitation is not visualized. Mild aortic valve sclerosis is present, with no evidence of aortic valve stenosis. Aortic valve peak gradient measures 7.2 mmHg. Pulmonic Valve: The pulmonic valve was normal in structure. Pulmonic valve regurgitation is not visualized. No evidence of pulmonic stenosis. Aorta: The aortic root is normal in size and structure. Venous: The inferior vena cava is dilated in  size with greater than 50% respiratory variability, suggesting right atrial pressure of 8 mmHg. IAS/Shunts: No atrial level shunt detected by color flow Doppler.  LEFT VENTRICLE PLAX 2D LVIDd:         4.99 cm  Diastology LVIDs:         3.85 cm  LV e' medial:    7.62 cm/s LV PW:         1.25 cm  LV E/e' medial:  13.5 LV IVS:        1.21 cm  LV e' lateral:   11.60 cm/s LVOT diam:     1.70 cm  LV E/e' lateral: 8.9 LVOT Area:     2.27 cm  RIGHT VENTRICLE RV Mid diam:    3.12 cm RV S prime:     13.20 cm/s TAPSE (M-mode): 1.7 cm LEFT ATRIUM             Index       RIGHT ATRIUM           Index LA diam:        4.00 cm 2.00 cm/m  RA Area:     14.20 cm LA Vol (A2C):   75.5 ml 37.81 ml/m RA Volume:   30.30 ml  15.17 ml/m LA Vol (A4C):   61.1 ml 30.60 ml/m LA Biplane Vol: 69.6 ml 34.86 ml/m  AORTIC VALVE                PULMONIC VALVE AV Area (Vmax): 1.52 cm    PV Vmax:        0.83 m/s AV Vmax:        134.00 cm/s PV Peak grad:   2.7 mmHg AV Peak Grad:   7.2 mmHg    RVOT Peak grad: 2 mmHg LVOT Vmax:      90.00 cm/s  AORTA Ao Root diam: 2.70 cm MITRAL VALVE                TRICUSPID VALVE MV Area (PHT): 5.13 cm     TR Peak grad:   23.8 mmHg MV Decel Time: 148 msec     TR Vmax:        244.00 cm/s MV E velocity: 103.00 cm/s                             SHUNTS                             Systemic Diam: 1.70 cm Ida Rogue MD Electronically signed by Ida Rogue MD Signature Date/Time: 04/16/2021/8:22:41 PM    Final    Medications:  Scheduled Meds: . aspirin EC  81 mg Oral Daily  . atorvastatin  40 mg Oral Daily  . Chlorhexidine Gluconate Cloth  6 each Topical Daily  . feeding supplement  237 mL Oral BID BM  . folic acid  1 mg Oral Daily  . melatonin  20 mg Oral QHS  . metoprolol tartrate  12.5 mg Oral BID  . mupirocin ointment  1 application Nasal BID  . [START ON 04/18/2021] pantoprazole  40 mg Intravenous Q12H  . polyethylene glycol  17 g Oral Daily  . thiamine  100 mg Oral Daily   Continuous Infusions: . sodium chloride    . amiodarone 30 mg/hr (04/17/21 1258)  . pantoprozole (PROTONIX) infusion 8 mg/hr (04/17/21 1258)   PRN Meds:.chlorpheniramine-HYDROcodone, docusate sodium, HYDROcodone-acetaminophen, metoprolol  tartrate, polyethylene glycol   Assessment: Principal Problem:   Acute blood loss anemia Active Problems:   GIB (gastrointestinal bleeding)   Acute kidney injury superimposed on CKD (HCC)    Plan: Given patient's advanced age, NSTEMI, AKI, patient is at high risk for endoscopic procedures  However, this is a patient who is in need of antithrombotics/anticoagulants given his comorbidities and therefore risk of rebleeding and source of bleeding would ideally need to be determined and benefits may outweigh risks based on this  Cardiology is following the patient as per their initial consult note, preoperative clearance was deferred at that time pending work-up  Once cardiology clears the patient for the procedures, endoscopic procedures can be done if anesthesia is agreeable to proceed with endoscopy in this patient   Await cardiology clearance  Continue medical optimization  PPI IV twice daily  Continue serial CBCs and transfuse PRN Avoid NSAIDs Maintain 2 large-bore IV lines Please page GI with any acute hemodynamic changes, or signs of active GI bleeding   LOS:  2 days   Gabriel Antigua, MD 04/17/2021, 2:01 PM

## 2021-04-17 NOTE — Consult Note (Signed)
PHARMACY CONSULT NOTE  Pharmacy Consult for Electrolyte Monitoring and Replacement   Recent Labs: Potassium (mmol/L)  Date Value  04/17/2021 4.7   Magnesium (mg/dL)  Date Value  04/16/2021 2.1   Calcium (mg/dL)  Date Value  04/17/2021 8.1 (L)   Albumin (g/dL)  Date Value  04/15/2021 2.8 (L)   Phosphorus (mg/dL)  Date Value  04/16/2021 3.5   Sodium (mmol/L)  Date Value  04/17/2021 138   Corrected Ca 9.1 mg/dL  Assessment: 87 yo male with PMH of CAD, HTN, CKD, atrial fibrillation on Eliquis, COPD, HLD, CHF admitted with chest pain and an active GIB. Pharmacy has been consulted to monitor and replenish electrolytes.  Diuretics: furosemide 60 mg IV x 1  Goal of Therapy:  Potassium 4.0 - 5.1 mmol/L Magnesium 2.0 - 2.4 mg/dL All Other Electrolytes WNL  Plan:   No electrolyte replacement warranted for today  Recheck electrolytes in am  Dallie Piles ,PharmD Clinical Pharmacist 04/17/2021 7:09 AM

## 2021-04-18 ENCOUNTER — Encounter
Admission: EM | Disposition: A | Discharge status: Skilled Nursing Facility | Payer: Self-pay | Source: Home / Self Care | Attending: Internal Medicine

## 2021-04-18 ENCOUNTER — Inpatient Hospital Stay: Payer: Medicare Other | Admitting: Certified Registered Nurse Anesthetist

## 2021-04-18 ENCOUNTER — Encounter: Payer: Self-pay | Admitting: Anesthesiology

## 2021-04-18 DIAGNOSIS — K921 Melena: Secondary | ICD-10-CM | POA: Diagnosis not present

## 2021-04-18 DIAGNOSIS — K3189 Other diseases of stomach and duodenum: Secondary | ICD-10-CM

## 2021-04-18 DIAGNOSIS — I214 Non-ST elevation (NSTEMI) myocardial infarction: Secondary | ICD-10-CM | POA: Diagnosis not present

## 2021-04-18 DIAGNOSIS — K449 Diaphragmatic hernia without obstruction or gangrene: Secondary | ICD-10-CM | POA: Diagnosis not present

## 2021-04-18 DIAGNOSIS — D62 Acute posthemorrhagic anemia: Secondary | ICD-10-CM | POA: Diagnosis not present

## 2021-04-18 HISTORY — PX: ESOPHAGOGASTRODUODENOSCOPY: SHX5428

## 2021-04-18 LAB — CBC
HCT: 25.7 % — ABNORMAL LOW (ref 39.0–52.0)
Hemoglobin: 8.2 g/dL — ABNORMAL LOW (ref 13.0–17.0)
MCH: 31.7 pg (ref 26.0–34.0)
MCHC: 31.9 g/dL (ref 30.0–36.0)
MCV: 99.2 fL (ref 80.0–100.0)
Platelets: 188 10*3/uL (ref 150–400)
RBC: 2.59 MIL/uL — ABNORMAL LOW (ref 4.22–5.81)
RDW: 21.2 % — ABNORMAL HIGH (ref 11.5–15.5)
WBC: 12.3 10*3/uL — ABNORMAL HIGH (ref 4.0–10.5)
nRBC: 0.2 % (ref 0.0–0.2)

## 2021-04-18 LAB — KOH PREP: Special Requests: NORMAL

## 2021-04-18 LAB — GLUCOSE, CAPILLARY
Glucose-Capillary: 101 mg/dL — ABNORMAL HIGH (ref 70–99)
Glucose-Capillary: 102 mg/dL — ABNORMAL HIGH (ref 70–99)
Glucose-Capillary: 103 mg/dL — ABNORMAL HIGH (ref 70–99)
Glucose-Capillary: 105 mg/dL — ABNORMAL HIGH (ref 70–99)
Glucose-Capillary: 115 mg/dL — ABNORMAL HIGH (ref 70–99)
Glucose-Capillary: 98 mg/dL (ref 70–99)

## 2021-04-18 LAB — COMPREHENSIVE METABOLIC PANEL
ALT: 14 U/L (ref 0–44)
AST: 20 U/L (ref 15–41)
Albumin: 2.2 g/dL — ABNORMAL LOW (ref 3.5–5.0)
Alkaline Phosphatase: 79 U/L (ref 38–126)
Anion gap: 7 (ref 5–15)
BUN: 114 mg/dL — ABNORMAL HIGH (ref 8–23)
CO2: 19 mmol/L — ABNORMAL LOW (ref 22–32)
Calcium: 7.8 mg/dL — ABNORMAL LOW (ref 8.9–10.3)
Chloride: 109 mmol/L (ref 98–111)
Creatinine, Ser: 3.49 mg/dL — ABNORMAL HIGH (ref 0.61–1.24)
GFR, Estimated: 16 mL/min — ABNORMAL LOW (ref >60)
Glucose, Bld: 104 mg/dL — ABNORMAL HIGH (ref 70–99)
Potassium: 4.8 mmol/L (ref 3.5–5.1)
Sodium: 135 mmol/L (ref 135–145)
Total Bilirubin: 0.8 mg/dL (ref 0.3–1.2)
Total Protein: 4.3 g/dL — ABNORMAL LOW (ref 6.5–8.1)

## 2021-04-18 LAB — MAGNESIUM: Magnesium: 1.8 mg/dL (ref 1.7–2.4)

## 2021-04-18 LAB — PHOSPHORUS: Phosphorus: 3.6 mg/dL (ref 2.5–4.6)

## 2021-04-18 SURGERY — EGD (ESOPHAGOGASTRODUODENOSCOPY)
Anesthesia: General

## 2021-04-18 MED ORDER — GLYCOPYRROLATE 0.2 MG/ML IJ SOLN
INTRAMUSCULAR | Status: DC | PRN
  Administered 2021-04-18: .15 mg via INTRAVENOUS

## 2021-04-18 MED ORDER — LIDOCAINE HCL (CARDIAC) PF 100 MG/5ML IV SOSY
PREFILLED_SYRINGE | INTRAVENOUS | Status: DC | PRN
  Administered 2021-04-18: 80 mg via INTRAVENOUS

## 2021-04-18 MED ORDER — SODIUM CHLORIDE 0.9 % IV SOLN: INTRAVENOUS | Status: DC

## 2021-04-18 MED ORDER — PROPOFOL 500 MG/50ML IV EMUL
INTRAVENOUS | Status: DC | PRN
  Administered 2021-04-18: 160 ug/kg/min via INTRAVENOUS

## 2021-04-18 MED ORDER — PROPOFOL 10 MG/ML IV BOLUS
INTRAVENOUS | Status: DC | PRN
  Administered 2021-04-18: 60 mg via INTRAVENOUS

## 2021-04-18 MED ORDER — PEG 3350-KCL-NA BICARB-NACL 420 G PO SOLR
4000.0000 mL | Freq: Once | ORAL | Status: DC
  Filled 2021-04-18: qty 4000

## 2021-04-18 MED ORDER — EPHEDRINE SULFATE 50 MG/ML IJ SOLN
INTRAMUSCULAR | Status: DC | PRN
  Administered 2021-04-18 (×2): 5 mg via INTRAVENOUS

## 2021-04-18 MED ORDER — PROPOFOL 500 MG/50ML IV EMUL
INTRAVENOUS | Status: AC
  Filled 2021-04-18: qty 50

## 2021-04-18 NOTE — Anesthesia Postprocedure Evaluation (Signed)
Anesthesia Post Note  Patient: Gabriel King  Procedure(s) Performed: ESOPHAGOGASTRODUODENOSCOPY (EGD) (N/A )  Patient location during evaluation: Endoscopy Anesthesia Type: General Level of consciousness: awake and alert Pain management: pain level controlled Vital Signs Assessment: post-procedure vital signs reviewed and stable Respiratory status: spontaneous breathing, nonlabored ventilation, respiratory function stable and patient connected to nasal cannula oxygen Cardiovascular status: blood pressure returned to baseline and stable Postop Assessment: no apparent nausea or vomiting Anesthetic complications: no   No complications documented.   Last Vitals:  Vitals:   04/18/21 1302 04/18/21 1312  BP: (!) 97/57 112/87  Pulse: 86 80  Resp: (!) 26 16  Temp:    SpO2: 100% 100%    Last Pain:  Vitals:   04/18/21 1312  TempSrc:   PainSc: 0-No pain                 Martha Clan

## 2021-04-18 NOTE — Progress Notes (Signed)
PROGRESS NOTE    Gabriel King  [id number] DOB: [birth year] DOA: 04/15/2021 PCP: System, Provider Not In  Brief Narrative: Chronically ill 87 year old male with history of CAD, multiple PCI and stents, chronic CHF, COPD, former smoker, CKD 4, paroxysmal atrial fibrillation on Eliquis, resident of [place], currently visiting [place] for his grandsons graduation/church celebration, presented to the emergency room 5/14 with chest pain, worsening dyspnea on exertion for few weeks, melena. -In the ER he was noted to have a hemoglobin of 4, creatinine of 3.7, high-sensitivity Troponin of 10,000 -Admitted to the ICU with upper GI bleed, AKI on CKD 4 and non-STEMI -Transfused 4 units of PRBC, Eliquis held -Transferred to [facility] service 5/15  Assessment & Plan:   Suspected upper GI bleed Severe acute blood loss anemia -Transfused 4 units of PRBC, also given Kcentra to reverse anticoagulation, Eliquis on hold -Continue IV PPI, gastroenterology following,  -Hemoglobin improved, continue to monitor this -Endoscopy today  Non-STEMI -High-sensitivity troponin peaked at 10,000, cardiology following -Requested records from [place] again, called Dr. Marton Redwood office, d/w  His PA, they do not know of any cardiac stents, she tells me he has had a iliac stent in 04/2018.  Last echo in 1/21 noted EF of 55-to 60% with normal wall motion abnormality and severely dilated RV -2D echo here notes EF of 45% with hypokinesis of mid to distal anterior and anteroseptal apical wall  -For medical management only given severe anemia, advanced CKD -Continue aspirin, Coreg  AKI on CKD 4 -Baseline creatinine around 2.7 per cardiology office -On admission creatinine 3.5 range, likely hemodynamically mediated secondary to non-STEMI and blood loss, slight improvement, continue to monitor -Given IV Lasix x1 yesterday, volume status is better, hold diuretics today -Monitor kidney function closely, urine output  is good -No evidence of hydronephrosis  Paroxysmal atrial fibrillation -Heart rate controlled for the most part, -Continue metoprolol, was briefly on amiodarone drip earlier, now p.o. -Anticoagulation on hold in setting of severe anemia, upper GI bleed  COPD Former smoker -Nebs as needed, no wheezing at this time  Alcohol abuse -Counseled, add thiamine, monitor for withdrawal  DVT prophylaxis: SCDs Code Status: DNR Family Communication: No family at bedside, called and updated patient's spouse Santiago Glad Disposition Plan:  Status is: Inpatient  Remains inpatient appropriate because:Inpatient level of care appropriate due to severity of illness  Dispo: The patient is from: Home              Anticipated d/c is to: Home              Patient currently is not medically stable to d/c.   Difficult to place patient No  Consultants:   Cardiology  PCCM transfer  Gastroenterology   Procedures:   Antimicrobials:    Subjective: -Feels better today, n.p.o. for endoscopy, denies any chest pain, breathing improving  Objective: Vitals:   04/18/21 1252 04/18/21 1302 04/18/21 1312 04/18/21 1322  BP: (!) 90/48 (!) 97/57 112/87 137/73  Pulse: 81 86 80 80  Resp: 14 (!) '26 16 17  '$ Temp:      TempSrc: Temporal     SpO2: 98% 100% 100% 100%  Weight:      Height:        Intake/Output Summary (Last 24 hours) at 04/18/2021 1412 Last data filed at 04/18/2021 1245 Gross per 24 hour  Intake 579.17 ml  Output 976 ml  Net -396.83 ml   Filed Weights   04/16/21 0500 04/17/21 0500 04/18/21 0357  Weight: 79.8 kg  80.3 kg 80.5 kg    Examination:  General exam: Chronically ill elderly male sitting up in bed, awake alert oriented to self and place, mild memory deficits noted CVS: S1-S2, regular rate rhythm Lungs: Poor air movement bilaterally, few basilar rales Abdomen: Soft, nontender, bowel sounds present Extremities: 1+ edema, hyperpigmentation and venous stasis changes, linear wound on  his right tibia with dressing Skin: As above Neuro: Moves all extremities, no localizing signs   Data Reviewed:   CBC: Recent Labs  Lab 04/15/21 0111 04/15/21 0752 04/15/21 1442 04/16/21 0212 04/16/21 0807 04/16/21 1216 04/17/21 0313 04/18/21 0427  WBC 22.7*  23.3* 19.4*  --  19.6*  --  16.6* 13.4* 12.3*  NEUTROABS 19.5* 15.6*  --   --   --   --   --   --   HGB 4.1*  4.1* 7.4*  7.6*   < > 8.9* 9.7* 8.5* 8.6* 8.2*  HCT 13.3*  13.2* 23.4*  23.4*   < > 27.1* 28.9* 26.2* 26.4* 25.7*  MCV 111.8*  110.0* 101.3*  --  96.1  --  96.7 97.1 99.2  PLT 269  268 190  --  198  --  185 208 188   < > = values in this interval not displayed.   Basic Metabolic Panel: Recent Labs  Lab 04/15/21 0111 04/15/21 0752 04/16/21 0212 04/17/21 0313 04/18/21 0427  NA 136 134* 139 138 135  K 5.7* 5.3* 4.9 4.7 4.8  CL 107 109 111 111 109  CO2 14* 16* 18* 20* 19*  GLUCOSE 135* 122* 112* 124* 104*  BUN 163* 157* 152* 132* 114*  CREATININE 3.74* 3.41* 3.57* 3.39* 3.49*  CALCIUM 8.6* 7.8* 8.2* 8.1* 7.8*  MG  --  2.1 2.1  --  1.8  PHOS  --  3.5 3.5  --  3.6   GFR: Estimated Creatinine Clearance: 16.2 mL/min (A) (by C-G formula based on SCr of 3.49 mg/dL (H)). Liver Function Tests: Recent Labs  Lab 04/15/21 0111 04/18/21 0427  AST 31 20  ALT 15 14  ALKPHOS 85 79  BILITOT 0.7 0.8  PROT 4.8* 4.3*  ALBUMIN 2.8* 2.2*   No results for input(s): LIPASE, AMYLASE in the last 168 hours. No results for input(s): AMMONIA in the last 168 hours. Coagulation Profile: Recent Labs  Lab 04/15/21 0111  INR 1.6*   Cardiac Enzymes: No results for input(s): CKTOTAL, CKMB, CKMBINDEX, TROPONINI in the last 168 hours. BNP (last 3 results) No results for input(s): PROBNP in the last 8760 hours. HbA1C: Recent Labs    04/16/21 0212  HGBA1C 5.2   CBG: Recent Labs  Lab 04/17/21 1950 04/17/21 2323 04/18/21 0315 04/18/21 0724 04/18/21 1113  GLUCAP 129* 121* 105* 101* 103*   Lipid Profile: No  results for input(s): CHOL, HDL, LDLCALC, TRIG, CHOLHDL, LDLDIRECT in the last 72 hours. Thyroid Function Tests: No results for input(s): TSH, T4TOTAL, FREET4, T3FREE, THYROIDAB in the last 72 hours. Anemia Panel: Recent Labs    04/15/21 1442  VITAMINB12 357  FOLATE 5.1*  FERRITIN 38  TIBC 272  IRON 39*   Urine analysis:    Component Value Date/Time   COLORURINE STRAW (A) 04/15/2021 0326   APPEARANCEUR CLEAR (A) 04/15/2021 0326   LABSPEC 1.013 04/15/2021 0326   PHURINE 5.0 04/15/2021 0326   GLUCOSEU NEGATIVE 04/15/2021 0326   HGBUR NEGATIVE 04/15/2021 0326   BILIRUBINUR NEGATIVE 04/15/2021 0326   KETONESUR NEGATIVE 04/15/2021 0326   PROTEINUR NEGATIVE 04/15/2021 0326   NITRITE NEGATIVE  04/15/2021 0326   LEUKOCYTESUR NEGATIVE 04/15/2021 0326   Sepsis Labs: '@LABRCNTIP'$ (procalcitonin:4,lacticidven:4)  ) Recent Results (from the past 240 hour(s))  Culture, blood (Routine X 2) w Reflex to ID Panel     Status: None (Preliminary result)   Collection Time: 04/15/21  1:51 AM   Specimen: BLOOD  Result Value Ref Range Status   Specimen Description BLOOD BLOOD RIGHT FOREARM  Final   Special Requests   Final    BOTTLES DRAWN AEROBIC AND ANAEROBIC Blood Culture adequate volume   Culture   Final    NO GROWTH 2 DAYS Performed at [facility], [address] [place], [place] [address]    Report Status PENDING  Incomplete  Culture, blood (Routine X 2) w Reflex to ID Panel     Status: None (Preliminary result)   Collection Time: 04/15/21  1:52 AM   Specimen: BLOOD  Result Value Ref Range Status   Specimen Description BLOOD RIGHT ANTECUBITAL  Final   Special Requests   Final    BOTTLES DRAWN AEROBIC AND ANAEROBIC Blood Culture adequate volume   Culture   Final    NO GROWTH 2 DAYS Performed at [facility], [address], [place], [place] [address]    Report Status PENDING  Incomplete  Urine Culture     Status: None   Collection Time: 04/15/21  3:26 AM    Specimen: Urine, Random  Result Value Ref Range Status   Specimen Description   Final    URINE, RANDOM Performed at [facility], [address] [place], [place] [address]    Special Requests   Final    NONE Performed at [facility], [address], [place], [place] [address]    Culture   Final    NO GROWTH Performed at [facility], [place] [address], [place], [place] [address]    Report Status 04/16/2021 FINAL  Final  Resp Panel by RT-PCR (Flu A&B, Covid)     Status: None   Collection Time: 04/15/21  4:59 AM   Specimen: Nasopharyngeal(NP) swabs in vial transport medium  Result Value Ref Range Status   SARS Coronavirus 2 by RT PCR NEGATIVE NEGATIVE Final    Comment: (NOTE) SARS-CoV-2 target nucleic acids are NOT DETECTED.  The SARS-CoV-2 RNA is generally detectable in upper respiratory specimens during the acute phase of infection. The lowest concentration of SARS-CoV-2 viral copies this assay can detect is 138 copies/mL. A negative result does not preclude SARS-Cov-2 infection and should not be used as the sole basis for treatment or other patient management decisions. A negative result may occur with  improper specimen collection/handling, submission of specimen other than nasopharyngeal swab, presence of viral mutation(s) within the areas targeted by this assay, and inadequate number of viral copies(<138 copies/mL). A negative result must be combined with clinical observations, patient history, and epidemiological information. The expected result is Negative.  Fact Sheet for Patients:  EntrepreneurPulse.com.au  Fact Sheet for Healthcare Providers:  IncredibleEmployment.be  This test is no t yet approved or cleared by the [place] FDA and  has been authorized for detection and/or diagnosis of SARS-CoV-2 by FDA under an Emergency Use Authorization (EUA). This EUA will remain  in effect (meaning this test  can be used) for the duration of the COVID-19 declaration under Section 564(b)(1) of the Act, 21 U.S.C.section 360bbb-3(b)(1), unless the authorization is terminated  or revoked sooner.       Influenza A by PCR NEGATIVE NEGATIVE Final  Influenza B by PCR NEGATIVE NEGATIVE Final    Comment: (NOTE) The Xpert Xpress SARS-CoV-2/FLU/RSV plus assay is intended as an aid in the diagnosis of influenza from Nasopharyngeal swab specimens and should not be used as a sole basis for treatment. Nasal washings and aspirates are unacceptable for Xpert Xpress SARS-CoV-2/FLU/RSV testing.  Fact Sheet for Patients: EntrepreneurPulse.com.au  Fact Sheet for Healthcare Providers: IncredibleEmployment.be  This test is not yet approved or cleared by the [place] FDA and has been authorized for detection and/or diagnosis of SARS-CoV-2 by FDA under an Emergency Use Authorization (EUA). This EUA will remain in effect (meaning this test can be used) for the duration of the COVID-19 declaration under Section 564(b)(1) of the Act, 21 U.S.C. section 360bbb-3(b)(1), unless the authorization is terminated or revoked.  Performed at [facility], [address] [place], [place] [address]   MRSA PCR Screening     Status: Abnormal   Collection Time: 04/15/21  6:13 AM   Specimen: Nasopharyngeal  Result Value Ref Range Status   MRSA by PCR POSITIVE (A) NEGATIVE Final    Comment:        The GeneXpert MRSA Assay (FDA approved for NASAL specimens only), is one component of a comprehensive MRSA colonization surveillance program. It is not intended to diagnose MRSA infection nor to guide or monitor treatment for MRSA infections. RESULT CALLED TO, READ BACK BY AND VERIFIED WITH: [facility] FURR AT [id number] 04/15/21.PMF Performed at [facility], [address] [place], [place] [address]   KOH prep     Status: None   Collection Time: 04/18/21 12:46 PM    Specimen: Esophagus  Result Value Ref Range Status   Specimen Description ESOPHAGUS  Final   Special Requests Normal  Final   KOH Prep   Final    YEAST WITH PSEUDOHYPHAE Performed at [facility], [address] [place], [place] [address]    Report Status 04/18/2021 FINAL  Final         Radiology Studies: ECHOCARDIOGRAM COMPLETE  Result Date: 04/16/2021    ECHOCARDIOGRAM REPORT   Patient Name:   PINK MOREIN Date of Exam: 04/16/2021 Medical Rec #:  [id number]   Height:       71.0 in Accession #:    [id number]  Weight:       175.9 lb Date of Birth:  1932/04/26  BSA:          1.997 m Patient Age:    81 years    BP:           125/103 mmHg Patient Gender: M           HR:           78 bpm. Exam Location:  [facility] Procedure: 2D Echo, Cardiac Doppler and Color Doppler Indications:     Chest Pain R07.9  History:         Patient has no prior history of Echocardiogram examinations.                  CHF; CAD.  Sonographer:     Alyse Low Roar Referring Phys:  [id number] Arvil Chaco Diagnosing Phys: Ida Rogue MD IMPRESSIONS  1. Left ventricular ejection fraction, by estimation, is 45 to 50%. The left ventricle has mildly decreased function. The left ventricle demonstrates regional wall motion abnormalities (Hypokinesis of the mid to distal anterior and anteroseptal and apical region). There is mild left ventricular hypertrophy. Left ventricular diastolic parameters are indeterminate.  2. Right  ventricular systolic function is normal. The right ventricular size is normal. There is normal pulmonary artery systolic pressure. The estimated right ventricular systolic pressure is 123XX123 mmHg.  3. Left atrial size was mildly dilated.  4. The inferior vena cava is dilated in size with >50% respiratory variability, suggesting right atrial pressure of 8 mmHg. FINDINGS  Left Ventricle: Left ventricular ejection fraction, by estimation, is 45 to 50%. The left ventricle has mildly decreased function. The left  ventricle demonstrates regional wall motion abnormalities. The left ventricular internal cavity size was normal in size. There is mild left ventricular hypertrophy. Left ventricular diastolic parameters are indeterminate. Right Ventricle: The right ventricular size is normal. No increase in right ventricular wall thickness. Right ventricular systolic function is normal. There is normal pulmonary artery systolic pressure. The tricuspid regurgitant velocity is 2.44 m/s, and  with an assumed right atrial pressure of 10 mmHg, the estimated right ventricular systolic pressure is 123XX123 mmHg. Left Atrium: Left atrial size was mildly dilated. Right Atrium: Right atrial size was normal in size. Pericardium: There is no evidence of pericardial effusion. Mitral Valve: The mitral valve is normal in structure. No evidence of mitral valve regurgitation. No evidence of mitral valve stenosis. Tricuspid Valve: The tricuspid valve is normal in structure. Tricuspid valve regurgitation is mild . No evidence of tricuspid stenosis. Aortic Valve: The aortic valve was not well visualized. Aortic valve regurgitation is not visualized. Mild aortic valve sclerosis is present, with no evidence of aortic valve stenosis. Aortic valve peak gradient measures 7.2 mmHg. Pulmonic Valve: The pulmonic valve was normal in structure. Pulmonic valve regurgitation is not visualized. No evidence of pulmonic stenosis. Aorta: The aortic root is normal in size and structure. Venous: The inferior vena cava is dilated in size with greater than 50% respiratory variability, suggesting right atrial pressure of 8 mmHg. IAS/Shunts: No atrial level shunt detected by color flow Doppler.  LEFT VENTRICLE PLAX 2D LVIDd:         4.99 cm  Diastology LVIDs:         3.85 cm  LV e' medial:    7.62 cm/s LV PW:         1.25 cm  LV E/e' medial:  13.5 LV IVS:        1.21 cm  LV e' lateral:   11.60 cm/s LVOT diam:     1.70 cm  LV E/e' lateral: 8.9 LVOT Area:     2.27 cm  RIGHT  VENTRICLE RV Mid diam:    3.12 cm RV S prime:     13.20 cm/s TAPSE (M-mode): 1.7 cm LEFT ATRIUM             Index       RIGHT ATRIUM           Index LA diam:        4.00 cm 2.00 cm/m  RA Area:     14.20 cm LA Vol (A2C):   75.5 ml 37.81 ml/m RA Volume:   30.30 ml  15.17 ml/m LA Vol (A4C):   61.1 ml 30.60 ml/m LA Biplane Vol: 69.6 ml 34.86 ml/m  AORTIC VALVE                PULMONIC VALVE AV Area (Vmax): 1.52 cm    PV Vmax:        0.83 m/s AV Vmax:        134.00 cm/s PV Peak grad:   2.7 mmHg AV Peak Grad:   7.2  mmHg    RVOT Peak grad: 2 mmHg LVOT Vmax:      90.00 cm/s  AORTA Ao Root diam: 2.70 cm MITRAL VALVE                TRICUSPID VALVE MV Area (PHT): 5.13 cm     TR Peak grad:   23.8 mmHg MV Decel Time: 148 msec     TR Vmax:        244.00 cm/s MV E velocity: 103.00 cm/s                             SHUNTS                             Systemic Diam: 1.70 cm Ida Rogue MD Electronically signed by Ida Rogue MD Signature Date/Time: 04/16/2021/8:22:41 PM    Final    Scheduled Meds: . amiodarone  400 mg Oral BID  . aspirin EC  81 mg Oral Daily  . atorvastatin  40 mg Oral Daily  . Chlorhexidine Gluconate Cloth  6 each Topical Daily  . feeding supplement  237 mL Oral BID BM  . folic acid  1 mg Oral Daily  . melatonin  20 mg Oral QHS  . metoprolol tartrate  12.5 mg Oral BID  . mupirocin ointment  1 application Nasal BID  . pantoprazole  40 mg Intravenous Q12H  . polyethylene glycol  17 g Oral Daily  . polyethylene glycol-electrolytes  4,000 mL Oral Once  . thiamine  100 mg Oral Daily   Continuous Infusions:    LOS: 3 days    Time spent: 21mn  PDomenic Polite MD [facility]  04/18/2021, 2:12 PM

## 2021-04-18 NOTE — Progress Notes (Signed)
Patient c/o urinary urgency & pressure in his lower abdomen. Patient bladder scanned with results showing >394. Message sent to Dr. Damita Dunnings to obtain an order to In & Out Cath patient. Once the order was received, In & Out cath was completed with 800 mls or urine removed. Patient states he feel much better with no pressure in lower abdomen.

## 2021-04-18 NOTE — Transfer of Care (Signed)
Immediate Anesthesia Transfer of Care Note  Patient: Gabriel King  Procedure(s) Performed: ESOPHAGOGASTRODUODENOSCOPY (EGD) (N/A )  Patient Location: PACU  Anesthesia Type:General  Level of Consciousness: drowsy  Airway & Oxygen Therapy: Patient Spontanous Breathing and Patient connected to nasal cannula oxygen  Post-op Assessment: Report given to RN and Post -op Vital signs reviewed and stable  Post vital signs: Reviewed and stable  Last Vitals:  Vitals Value Taken Time  BP    Temp    Pulse    Resp    SpO2      Last Pain:  Vitals:   04/18/21 0800  TempSrc:   PainSc: 0-No pain      Patients Stated Pain Goal: 0 ([id number])  Complications: No complications documented.

## 2021-04-18 NOTE — Progress Notes (Signed)
Gabriel Antigua, MD [address] [place], [place], [name], [address] [address], [place], [place], [name], [address] Phone: [phone]  Fax: [phone]   Subjective: No further evidence of melena.  Patient hemodynamically stable.  Patient denies any abdominal pain.   Objective: Exam: Vital signs in last 24 hours: Vitals:   04/18/21 0800 04/18/21 0900 04/18/21 0912 04/18/21 1100  BP: (!) 115/46  (!) 102/54 (!) 109/56  Pulse: 60 (!) 58 (!) 54 (!) 52  Resp: 16 (!) 21  (!) 22  Temp:      TempSrc:      SpO2: 98% 100%  98%  Weight:      Height:       Weight change: 0.2 kg  Intake/Output Summary (Last 24 hours) at 04/18/2021 1230 Last data filed at 04/18/2021 0900 Gross per 24 hour  Intake 435.19 ml  Output 1176 ml  Net -740.81 ml    General: No acute distress, AAO x3 Abd: Soft, NT/ND, No HSM Skin: Warm, no rashes Neck: Supple, Trachea midline   Lab Results: Lab Results  Component Value Date   WBC 12.3 (H) 04/18/2021   HGB 8.2 (L) 04/18/2021   HCT 25.7 (L) 04/18/2021   MCV 99.2 04/18/2021   PLT 188 04/18/2021   Micro Results: Recent Results (from the past 240 hour(s))  Culture, blood (Routine X 2) w Reflex to ID Panel     Status: None (Preliminary result)   Collection Time: 04/15/21  1:51 AM   Specimen: BLOOD  Result Value Ref Range Status   Specimen Description BLOOD BLOOD RIGHT FOREARM  Final   Special Requests   Final    BOTTLES DRAWN AEROBIC AND ANAEROBIC Blood Culture adequate volume   Culture   Final    NO GROWTH 2 DAYS Performed at [facility], [address] [place], [place] [address]    Report Status PENDING  Incomplete  Culture, blood (Routine X 2) w Reflex to ID Panel     Status: None (Preliminary result)   Collection Time: 04/15/21  1:52 AM   Specimen: BLOOD  Result Value Ref Range Status   Specimen Description BLOOD RIGHT ANTECUBITAL  Final   Special Requests   Final    BOTTLES DRAWN AEROBIC AND ANAEROBIC Blood  Culture adequate volume   Culture   Final    NO GROWTH 2 DAYS Performed at [facility], [address] [place], [place] [address]    Report Status PENDING  Incomplete  Urine Culture     Status: None   Collection Time: 04/15/21  3:26 AM   Specimen: Urine, Random  Result Value Ref Range Status   Specimen Description   Final    URINE, RANDOM Performed at [facility], [address], [place], [place] [address]    Special Requests   Final    NONE Performed at [facility], [address], [place], [place] [address]    Culture   Final    NO GROWTH Performed at [facility], [place] [address], [place], [place] [address]    Report Status 04/16/2021 FINAL  Final  Resp Panel by RT-PCR (Flu A&B, Covid)     Status: None   Collection Time: 04/15/21  4:59 AM   Specimen: Nasopharyngeal(NP) swabs in vial transport medium  Result Value Ref Range Status   SARS Coronavirus 2 by RT PCR NEGATIVE NEGATIVE Final    Comment: (NOTE) SARS-CoV-2 target nucleic acids are NOT DETECTED.  The SARS-CoV-2  RNA is generally detectable in upper respiratory specimens during the acute phase of infection. The lowest concentration of SARS-CoV-2 viral copies this assay can detect is 138 copies/mL. A negative result does not preclude SARS-Cov-2 infection and should not be used as the sole basis for treatment or other patient management decisions. A negative result may occur with  improper specimen collection/handling, submission of specimen other than nasopharyngeal swab, presence of viral mutation(s) within the areas targeted by this assay, and inadequate number of viral copies(<138 copies/mL). A negative result must be combined with clinical observations, patient history, and epidemiological information. The expected result is Negative.  Fact Sheet for Patients:  EntrepreneurPulse.com.au  Fact Sheet for Healthcare Providers:   IncredibleEmployment.be  This test is no t yet approved or cleared by the [place] FDA and  has been authorized for detection and/or diagnosis of SARS-CoV-2 by FDA under an Emergency Use Authorization (EUA). This EUA will remain  in effect (meaning this test can be used) for the duration of the COVID-19 declaration under Section 564(b)(1) of the Act, 21 U.S.C.section 360bbb-3(b)(1), unless the authorization is terminated  or revoked sooner.       Influenza A by PCR NEGATIVE NEGATIVE Final   Influenza B by PCR NEGATIVE NEGATIVE Final    Comment: (NOTE) The Xpert Xpress SARS-CoV-2/FLU/RSV plus assay is intended as an aid in the diagnosis of influenza from Nasopharyngeal swab specimens and should not be used as a sole basis for treatment. Nasal washings and aspirates are unacceptable for Xpert Xpress SARS-CoV-2/FLU/RSV testing.  Fact Sheet for Patients: EntrepreneurPulse.com.au  Fact Sheet for Healthcare Providers: IncredibleEmployment.be  This test is not yet approved or cleared by the [place] FDA and has been authorized for detection and/or diagnosis of SARS-CoV-2 by FDA under an Emergency Use Authorization (EUA). This EUA will remain in effect (meaning this test can be used) for the duration of the COVID-19 declaration under Section 564(b)(1) of the Act, 21 U.S.C. section 360bbb-3(b)(1), unless the authorization is terminated or revoked.  Performed at [facility], [address] [place], [place] [address]   MRSA PCR Screening     Status: Abnormal   Collection Time: 04/15/21  6:13 AM   Specimen: Nasopharyngeal  Result Value Ref Range Status   MRSA by PCR POSITIVE (A) NEGATIVE Final    Comment:        The GeneXpert MRSA Assay (FDA approved for NASAL specimens only), is one component of a comprehensive MRSA colonization surveillance program. It is not intended to diagnose MRSA infection nor to  guide or monitor treatment for MRSA infections. RESULT CALLED TO, READ BACK BY AND VERIFIED WITH: [facility] FURR AT [id number] 04/15/21.PMF Performed at [facility], [address] [place], [place] [address]    Studies/Results: US ARTERIAL ABI (SCREENING LOWER EXTREMITY)  Result Date: 04/16/2021 CLINICAL DATA:  Hypertension Right vascular ulcer EXAM: NONINVASIVE PHYSIOLOGIC VASCULAR STUDY OF BILATERAL LOWER EXTREMITIES TECHNIQUE: Evaluation of both lower extremities were performed at rest, including calculation of ankle-brachial indices with single level Doppler, pressure and pulse volume recording. COMPARISON:  None. FINDINGS: Right ABI:  Noncompressible Left ABI:  Noncompressible Right Lower Extremity: Monophasic waveform seen in dorsalis pedis. Multiphasic waveform seen in the posterior tibial. Left Lower Extremity: Monophasic waveform seen in the posterior tibial and dorsalis pedis arteries. > 1.4 Non diagnostic secondary to incompressible vessel calcifications (medial arterial sclerosis of [place]cke[place]) IMPRESSION: Nondiagnostic ABI examination of the lower extremities, likely due to diffusely calcified arteries. Electronically Signed   By: Sharen Heck  Mir M.D.   On: 04/16/2021 14:22   ECHOCARDIOGRAM COMPLETE  Result Date: 04/16/2021    ECHOCARDIOGRAM REPORT   Patient Name:   Gabriel King Date of Exam: 04/16/2021 Medical Rec #:  [id number]   Height:       71.0 in Accession #:    [id number]  Weight:       175.9 lb Date of Birth:  07-05-1932  BSA:          1.997 m Patient Age:    23 years    BP:           125/103 mmHg Patient Gender: M           HR:           78 bpm. Exam Location:  [facility] Procedure: 2D Echo, Cardiac Doppler and Color Doppler Indications:     Chest Pain R07.9  History:         Patient has no prior history of Echocardiogram examinations.                  CHF; CAD.  Sonographer:     Alyse Low Roar Referring Phys:  [id number] Arvil Chaco Diagnosing Phys: Ida Rogue MD IMPRESSIONS  1.  Left ventricular ejection fraction, by estimation, is 45 to 50%. The left ventricle has mildly decreased function. The left ventricle demonstrates regional wall motion abnormalities (Hypokinesis of the mid to distal anterior and anteroseptal and apical region). There is mild left ventricular hypertrophy. Left ventricular diastolic parameters are indeterminate.  2. Right ventricular systolic function is normal. The right ventricular size is normal. There is normal pulmonary artery systolic pressure. The estimated right ventricular systolic pressure is 123XX123 mmHg.  3. Left atrial size was mildly dilated.  4. The inferior vena cava is dilated in size with >50% respiratory variability, suggesting right atrial pressure of 8 mmHg. FINDINGS  Left Ventricle: Left ventricular ejection fraction, by estimation, is 45 to 50%. The left ventricle has mildly decreased function. The left ventricle demonstrates regional wall motion abnormalities. The left ventricular internal cavity size was normal in size. There is mild left ventricular hypertrophy. Left ventricular diastolic parameters are indeterminate. Right Ventricle: The right ventricular size is normal. No increase in right ventricular wall thickness. Right ventricular systolic function is normal. There is normal pulmonary artery systolic pressure. The tricuspid regurgitant velocity is 2.44 m/s, and  with an assumed right atrial pressure of 10 mmHg, the estimated right ventricular systolic pressure is 123XX123 mmHg. Left Atrium: Left atrial size was mildly dilated. Right Atrium: Right atrial size was normal in size. Pericardium: There is no evidence of pericardial effusion. Mitral Valve: The mitral valve is normal in structure. No evidence of mitral valve regurgitation. No evidence of mitral valve stenosis. Tricuspid Valve: The tricuspid valve is normal in structure. Tricuspid valve regurgitation is mild . No evidence of tricuspid stenosis. Aortic Valve: The aortic valve was not  well visualized. Aortic valve regurgitation is not visualized. Mild aortic valve sclerosis is present, with no evidence of aortic valve stenosis. Aortic valve peak gradient measures 7.2 mmHg. Pulmonic Valve: The pulmonic valve was normal in structure. Pulmonic valve regurgitation is not visualized. No evidence of pulmonic stenosis. Aorta: The aortic root is normal in size and structure. Venous: The inferior vena cava is dilated in size with greater than 50% respiratory variability, suggesting right atrial pressure of 8 mmHg. IAS/Shunts: No atrial level shunt detected by color flow Doppler.  LEFT VENTRICLE PLAX 2D LVIDd:  4.99 cm  Diastology LVIDs:         3.85 cm  LV e' medial:    7.62 cm/s LV PW:         1.25 cm  LV E/e' medial:  13.5 LV IVS:        1.21 cm  LV e' lateral:   11.60 cm/s LVOT diam:     1.70 cm  LV E/e' lateral: 8.9 LVOT Area:     2.27 cm  RIGHT VENTRICLE RV Mid diam:    3.12 cm RV S prime:     13.20 cm/s TAPSE (M-mode): 1.7 cm LEFT ATRIUM             Index       RIGHT ATRIUM           Index LA diam:        4.00 cm 2.00 cm/m  RA Area:     14.20 cm LA Vol (A2C):   75.5 ml 37.81 ml/m RA Volume:   30.30 ml  15.17 ml/m LA Vol (A4C):   61.1 ml 30.60 ml/m LA Biplane Vol: 69.6 ml 34.86 ml/m  AORTIC VALVE                PULMONIC VALVE AV Area (Vmax): 1.52 cm    PV Vmax:        0.83 m/s AV Vmax:        134.00 cm/s PV Peak grad:   2.7 mmHg AV Peak Grad:   7.2 mmHg    RVOT Peak grad: 2 mmHg LVOT Vmax:      90.00 cm/s  AORTA Ao Root diam: 2.70 cm MITRAL VALVE                TRICUSPID VALVE MV Area (PHT): 5.13 cm     TR Peak grad:   23.8 mmHg MV Decel Time: 148 msec     TR Vmax:        244.00 cm/s MV E velocity: 103.00 cm/s                             SHUNTS                             Systemic Diam: 1.70 cm Ida Rogue MD Electronically signed by Ida Rogue MD Signature Date/Time: 04/16/2021/8:22:41 PM    Final    Medications:  Scheduled Meds: . [MAR Hold] amiodarone  400 mg Oral BID  .  [MAR Hold] aspirin EC  81 mg Oral Daily  . [MAR Hold] atorvastatin  40 mg Oral Daily  . [MAR Hold] Chlorhexidine Gluconate Cloth  6 each Topical Daily  . [MAR Hold] feeding supplement  237 mL Oral BID BM  . [MAR Hold] folic acid  1 mg Oral Daily  . [MAR Hold] melatonin  20 mg Oral QHS  . [MAR Hold] metoprolol tartrate  12.5 mg Oral BID  . [MAR Hold] mupirocin ointment  1 application Nasal BID  . [MAR Hold] pantoprazole  40 mg Intravenous Q12H  . [MAR Hold] polyethylene glycol  17 g Oral Daily  . [MAR Hold] thiamine  100 mg Oral Daily   Continuous Infusions: . [MAR Hold] sodium chloride     PRN Meds:.[MAR Hold] chlorpheniramine-HYDROcodone, [MAR Hold] docusate sodium, [MAR Hold] HYDROcodone-acetaminophen, [MAR Hold] ipratropium-albuterol, [MAR Hold] metoprolol tartrate, [MAR Hold] polyethylene glycol   Assessment: Principal Problem:  Acute blood loss anemia Active Problems:   GIB (gastrointestinal bleeding)   Acute kidney injury superimposed on CKD [facility])   NSTEMI (non-ST elevated myocardial infarction) [facility])    Plan: Patient was evaluated by Dr. Fletcher Anon from cardiology and he recommends proceeding with endoscopic procedures for further evaluation of melena and anemia.  Anesthesiology, Dr. Rosey Bath has reviewed the patient's chart and is comfortable with proceeding given the above  I have discussed alternative options, risks & benefits,  which include, but are not limited to, bleeding, infection, perforation,respiratory complication & drug reaction.  The patient agrees with this plan & written consent will be obtained.      LOS: 3 days   Gabriel Antigua, MD 04/18/2021, 12:30 PM

## 2021-04-18 NOTE — Progress Notes (Signed)
1130 Over to GI lab via bed. Wife with patient. Sent to waiting room.

## 2021-04-18 NOTE — Progress Notes (Signed)
Patient A&O throughout shift with no signs of distress.  C/O pain to back, received PRN Pain med as ordered. No signs of bleeding noted.

## 2021-04-18 NOTE — Anesthesia Preprocedure Evaluation (Signed)
Anesthesia Evaluation  Patient identified by MRN, date of birth, ID band Patient awake    Reviewed: Allergy & Precautions, H&P , NPO status , Patient's Chart, lab work & pertinent test results, reviewed documented beta blocker date and time   History of Anesthesia Complications Negative for: history of anesthetic complications  Airway Mallampati: II  TM Distance: >3 FB Neck ROM: full    Dental  (+) Upper Dentures, Lower Dentures, Edentulous Upper, Edentulous Lower, Dental Advidsory Given   Pulmonary shortness of breath, COPD, neg recent URI, former smoker,    Pulmonary exam normal breath sounds clear to auscultation       Cardiovascular Exercise Tolerance: Good hypertension, (-) angina+ CAD, + Past MI, + Cardiac Stents and +CHF  (-) CABG + dysrhythmias Atrial Fibrillation (-) Valvular Problems/Murmurs Rhythm:regular Rate:Normal     Neuro/Psych negative neurological ROS  negative psych ROS   GI/Hepatic negative GI ROS, Neg liver ROS,   Endo/Other  negative endocrine ROS  Renal/GU CRFRenal disease  negative genitourinary   Musculoskeletal   Abdominal   Peds  Hematology  (+) Blood dyscrasia, anemia ,   Anesthesia Other Findings Past Medical History: No date: CHF (congestive heart failure) (HCC) No date: CKD (chronic kidney disease) No date: COPD (chronic obstructive pulmonary disease) (HCC) No date: Coronary artery disease 01/2021: COVID-19 No date: Hyperlipidemia No date: Hypertension  ECHO 04/16/21: Left ventricular ejection fraction, by estimation, is 45 to 50%.  Reproductive/Obstetrics negative OB ROS                             Anesthesia Physical Anesthesia Plan  ASA: III  Anesthesia Plan: General   Post-op Pain Management:    Induction: Intravenous  PONV Risk Score and Plan: 2 and TIVA and Propofol infusion  Airway Management Planned: Natural Airway and Nasal  Cannula  Additional Equipment:   Intra-op Plan:   Post-operative Plan:   Informed Consent: I have reviewed the patients History and Physical, chart, labs and discussed the procedure including the risks, benefits and alternatives for the proposed anesthesia with the patient or authorized representative who has indicated his/her understanding and acceptance.     Dental Advisory Given  Plan Discussed with: Anesthesiologist, CRNA and Surgeon  Anesthesia Plan Comments:         Anesthesia Quick Evaluation

## 2021-04-18 NOTE — Op Note (Signed)
[facility] Gastroenterology Patient Name: Gabriel King Procedure Date: 04/18/2021 12:24 PM MRN: [id number] Account #: [id number] Date of Birth: 1932/09/07 Admit Type: Inpatient Age: 87 Room: [facility] ENDO ROOM 3 Gender: Male Note Status: Finalized Procedure:             Upper GI endoscopy Indications:           Iron deficiency anemia, Melena Providers:             Eilah Common B. Bonna Gains MD, MD Referring MD:          Forest Gleason Md, MD (Referring MD) Medicines:             Monitored Anesthesia Care Complications:         No immediate complications. Procedure:             Pre-Anesthesia Assessment:                        - The risks and benefits of the procedure and the                         sedation options and risks were discussed with the                         patient. All questions were answered and informed                         consent was obtained.                        - Patient identification and proposed procedure were                         verified prior to the procedure.                        - ASA Grade Assessment: III - A patient with severe                         systemic disease.                        After obtaining informed consent, the endoscope was                         passed under direct vision. Throughout the procedure,                         the patient's blood pressure, pulse, and oxygen                         saturations were monitored continuously. The Endoscope                         was introduced through the mouth, and advanced to the                         second part of duodenum. The upper GI endoscopy was  accomplished with ease. The patient tolerated the                         procedure well. Findings:      White nummular lesions were noted in the distal esophagus. Brushings for       KOH prep were obtained.      A non-obstructing Schatzki ring was found in the distal esophagus.      The exam of  the esophagus was otherwise normal.      A medium-sized hiatal hernia was present.      The exam of the stomach was otherwise normal.      Patchy mildly erythematous mucosa without active bleeding and with no       stigmata of bleeding was found in the duodenal bulb.      The exam of the duodenum was otherwise normal. Impression:            - White nummular lesions in esophageal mucosa.                         Brushings performed.                        - Non-obstructing Schatzki ring.                        - Medium-sized hiatal hernia.                        - Erythematous duodenopathy. Recommendation:        - Return patient to hospital ward for ongoing care.                        - Continue Serial CBCs and transfuse PRN                        - Perform a colonoscopy tomorrow.                        - Perform an H. pylori serology today.                        - Clear liquid diet.                        - The findings and recommendations were discussed with                         the patient.                        - The findings and recommendations were discussed with                         the patient's family.                        - Findings on upper endoscopy today do not explain the                         source of patient's melena and anemia. Colonoscopy for  further evaluation of melena and acute anemia is                         indicated with possible Capsule study subsequently if                         colonoscopy is negative. This was discussed in detail                         with patient and wife at bedside today and risks and                         benefits of procedures discussed in detail including                         alternative conservative measures, and they agree to                         proceed with colonoscopy and possible capsule study                         tomorrow. Procedure Code(s):     --- Professional ---                         [phone], Esophagogastroduodenoscopy, flexible,                         transoral; diagnostic, including collection of                         specimen(s) by brushing or washing, when performed                         (separate procedure) Diagnosis Code(s):     --- Professional ---                        K22.8, Other specified diseases of esophagus                        K22.2, Esophageal obstruction                        K44.9, Diaphragmatic hernia without obstruction or                         gangrene                        K31.89, Other diseases of stomach and duodenum                        D50.9, Iron deficiency anemia, unspecified                        K92.1, Melena (includes Hematochezia) CPT copyright 2019 [employer]. All rights reserved. The codes documented in this report are preliminary and upon coder review may  be revised to meet current compliance requirements.  Vonda Antigua, MD Margretta Sidle B. Bonna Gains MD, MD 04/18/2021 1:10:33 PM This report has been signed electronically. Number of Addenda: 0  Note Initiated On: 04/18/2021 12:24 PM Estimated Blood Loss:  Estimated blood loss: none.      [facility]

## 2021-04-18 NOTE — Progress Notes (Signed)
Progress Note  Patient Name: Gabriel King Date of Encounter: 04/18/2021  Primary Cardiologist: [place]  Subjective   Shortness of breath largely unchanged.  No chest pain.  Reports she is generally "uncomfortable."  Hgb low though stable at 8.2.  Planning for EGD today.  Inpatient Medications    Scheduled Meds: . [MAR Hold] amiodarone  400 mg Oral BID  . [MAR Hold] aspirin EC  81 mg Oral Daily  . [MAR Hold] atorvastatin  40 mg Oral Daily  . [MAR Hold] Chlorhexidine Gluconate Cloth  6 each Topical Daily  . [MAR Hold] feeding supplement  237 mL Oral BID BM  . [MAR Hold] folic acid  1 mg Oral Daily  . [MAR Hold] melatonin  20 mg Oral QHS  . [MAR Hold] metoprolol tartrate  12.5 mg Oral BID  . [MAR Hold] mupirocin ointment  1 application Nasal BID  . [MAR Hold] pantoprazole  40 mg Intravenous Q12H  . [MAR Hold] polyethylene glycol  17 g Oral Daily  . polyethylene glycol-electrolytes  4,000 mL Oral Once  . [MAR Hold] thiamine  100 mg Oral Daily   Continuous Infusions: . sodium chloride     PRN Meds: [MAR Hold] chlorpheniramine-HYDROcodone, [MAR Hold] docusate sodium, [MAR Hold] HYDROcodone-acetaminophen, [MAR Hold] ipratropium-albuterol, [MAR Hold] metoprolol tartrate, [MAR Hold] polyethylene glycol   Vital Signs    Vitals:   04/18/21 1252 04/18/21 1302 04/18/21 1312 04/18/21 1322  BP: (!) 90/48 (!) 97/57 112/87 137/73  Pulse: 81 86 80 80  Resp: 14 (!) '26 16 17  '$ Temp:      TempSrc: Temporal     SpO2: 98% 100% 100% 100%  Weight:      Height:        Intake/Output Summary (Last 24 hours) at 04/18/2021 1341 Last data filed at 04/18/2021 1245 Gross per 24 hour  Intake 579.17 ml  Output 976 ml  Net -396.83 ml   Filed Weights   04/16/21 0500 04/17/21 0500 04/18/21 0357  Weight: 79.8 kg 80.3 kg 80.5 kg    Telemetry    Sinus bradycardia with occasional PACs - Personally Reviewed  ECG    No new tracings - Personally Reviewed  Physical Exam   GEN: No acute  distress.   Neck: No JVD. Cardiac: RRR, no murmurs, rubs, or gallops.  Respiratory: Clear to auscultation bilaterally.  GI: Soft, nontender, non-distended.   MS: Trace pretibial edema; No deformity. Neuro:  Alert and oriented x 3; Nonfocal.  Psych: Normal affect.  Labs    Chemistry Recent Labs  Lab 04/15/21 0111 04/15/21 0752 04/16/21 0212 04/17/21 0313 04/18/21 0427  NA 136   < > 139 138 135  K 5.7*   < > 4.9 4.7 4.8  CL 107   < > 111 111 109  CO2 14*   < > 18* 20* 19*  GLUCOSE 135*   < > 112* 124* 104*  BUN 163*   < > 152* 132* 114*  CREATININE 3.74*   < > 3.57* 3.39* 3.49*  CALCIUM 8.6*   < > 8.2* 8.1* 7.8*  PROT 4.8*  --   --   --  4.3*  ALBUMIN 2.8*  --   --   --  2.2*  AST 31  --   --   --  20  ALT 15  --   --   --  14  ALKPHOS 85  --   --   --  79  BILITOT 0.7  --   --   --  0.8  GFRNONAA 15*   < > 16* 17* 16*  ANIONGAP 15   < > '10 7 7   '$ < > = values in this interval not displayed.     Hematology Recent Labs  Lab 04/16/21 1216 04/17/21 0313 04/18/21 0427  WBC 16.6* 13.4* 12.3*  RBC 2.71* 2.72* 2.59*  HGB 8.5* 8.6* 8.2*  HCT 26.2* 26.4* 25.7*  MCV 96.7 97.1 99.2  MCH 31.4 31.6 31.7  MCHC 32.4 32.6 31.9  RDW 21.1* 21.1* 21.2*  PLT 185 208 188    Cardiac EnzymesNo results for input(s): TROPONINI in the last 168 hours. No results for input(s): TROPIPOC in the last 168 hours.   BNP Recent Labs  Lab 04/15/21 0111 04/15/21 1442  BNP 442.8* 650.6*     DDimer No results for input(s): DDIMER in the last 168 hours.   Radiology    US ARTERIAL ABI (SCREENING LOWER EXTREMITY)  Result Date: 04/16/2021 IMPRESSION: Nondiagnostic ABI examination of the lower extremities, likely due to diffusely calcified arteries. Electronically Signed   By: Miachel Roux M.D.   On: 04/16/2021 14:22   Cardiac Studies   2D echo 04/16/2021: 1. Left ventricular ejection fraction, by estimation, is 45 to 50%. The  left ventricle has mildly decreased function. The left  ventricle  demonstrates regional wall motion abnormalities (Hypokinesis of the mid to  distal anterior and anteroseptal and  apical region). There is mild left ventricular hypertrophy. Left  ventricular diastolic parameters are indeterminate.  2. Right ventricular systolic function is normal. The right ventricular  size is normal. There is normal pulmonary artery systolic pressure. The  estimated right ventricular systolic pressure is 123XX123 mmHg.  3. Left atrial size was mildly dilated.  4. The inferior vena cava is dilated in size with >50% respiratory  variability, suggesting right atrial pressure of 8 mmHg.  Patient Profile     87 y.o. male with history of nonobstructive CAD, PAF previously on Eliquis, HFpEF, CKD stage III-IV, HTN, HLD, COPD, prior tobacco use, current alcohol use, and anemia on iron supplementation who was admitted on 5/14 with chest pain in the setting of melena and GI bleeding with hemoglobin of 4.1 and high-sensitivity troponin peaking at 10535.  Assessment & Plan    1.  Symptomatic anemia with GI bleed: -Status post 4 units PRBC with relatively stable hemoglobin -Eliquis remains on hold -GI planning for EGD today -Evaluated by interventional cardiology and felt to be at least moderate risk for noncardiac procedure  2.  NSTEMI: -High-sensitivity troponin peaked at 10535 -Note indicates prior nonobstructive disease -Echo this admission with an EF of 45 to 50% with anterior, anteroseptal, and apical hypokinesis with prior notes indicating a normal EF -No chest pain this admission though does complain of dyspnea, possibly in the setting of underlying symptomatic anemia -Overall, this is a difficult situation given his severe symptomatic anemia requiring multiple units of PRBC and acute on CKD stage III-IV -He is not currently a cath candidate given his underlying anemia and acute on CKD -Cannot exclude some degree of supply demand ischemia in the setting of  severe anemia with significant renal dysfunction however, cannot exclude a primary cardiac event -Once he is improved from his acute illness and his anemia has been further evaluated and treated successfully he should undergo an ischemic evaluation, which will likely occur with his primary cardiologist in [place] continuing aspirin, if okay with GI -He remains on atorvastatin and Lopressor  3.  Acute on chronic combined systolic  and diastolic CHF: -He is known to have history of HFpEF now with some degree of systolic dysfunction with wall motion abnormality as outlined above -Received IV Lasix on 5/16, now with some progression in renal dysfunction -He remains on metoprolol -Not currently on ACE inhibitor/ARB/ARNI/MRA secondary to acute on CKD stage IV  4.  PAF: -Maintaining sinus rhythm -Lopressor -CHA2DS2-VASc at least 4 (CHF, HTN, age x2) -Eliquis on hold with symptomatic anemia with GI bleeding -Recommend continued deferment of Lena until resumption is advised by GI  5.  Acute on CKD stage IV: -IV diuresis held -Trend -Management per primary service  For questions or updates, please contact [place] Please consult [email or web address] for contact info under Cardiology/STEMI.    Signed, Christell Faith, PA-C [facility] Pager: [phone] 04/18/2021, 1:41 PM

## 2021-04-18 NOTE — Progress Notes (Deleted)
Patient stable throughout the night with no signs of distress. Denies pain. C/O anxiety & received PRN ativan as ordered. Right Femoral site intact with no bleeding noted. Patient had a 9 beat run of V-Tach earlier in the shift.

## 2021-04-18 NOTE — Anesthesia Procedure Notes (Signed)
Performed by: Demetrius Charity, CRNA Pre-anesthesia Checklist: Patient identified, Emergency Drugs available, Suction available, Patient being monitored and Timeout performed Oxygen Delivery Method: Nasal cannula Placement Confirmation: CO2 detector and positive ETCO2

## 2021-04-19 ENCOUNTER — Encounter
Admission: EM | Disposition: A | Discharge status: Skilled Nursing Facility | Payer: Self-pay | Source: Home / Self Care | Attending: Internal Medicine

## 2021-04-19 ENCOUNTER — Inpatient Hospital Stay: Payer: Medicare Other | Admitting: Anesthesiology

## 2021-04-19 ENCOUNTER — Encounter: Payer: Self-pay | Admitting: Gastroenterology

## 2021-04-19 DIAGNOSIS — D649 Anemia, unspecified: Secondary | ICD-10-CM

## 2021-04-19 DIAGNOSIS — K921 Melena: Secondary | ICD-10-CM | POA: Diagnosis not present

## 2021-04-19 DIAGNOSIS — D62 Acute posthemorrhagic anemia: Secondary | ICD-10-CM | POA: Diagnosis not present

## 2021-04-19 DIAGNOSIS — K635 Polyp of colon: Secondary | ICD-10-CM | POA: Diagnosis not present

## 2021-04-19 DIAGNOSIS — I214 Non-ST elevation (NSTEMI) myocardial infarction: Secondary | ICD-10-CM | POA: Diagnosis not present

## 2021-04-19 HISTORY — PX: GIVENS CAPSULE STUDY: SHX5432

## 2021-04-19 HISTORY — PX: COLONOSCOPY WITH PROPOFOL: SHX5780

## 2021-04-19 LAB — CBC
HCT: 29.1 % — ABNORMAL LOW (ref 39.0–52.0)
Hemoglobin: 9.3 g/dL — ABNORMAL LOW (ref 13.0–17.0)
MCH: 31.4 pg (ref 26.0–34.0)
MCHC: 32 g/dL (ref 30.0–36.0)
MCV: 98.3 fL (ref 80.0–100.0)
Platelets: 209 10*3/uL (ref 150–400)
RBC: 2.96 MIL/uL — ABNORMAL LOW (ref 4.22–5.81)
RDW: 21.2 % — ABNORMAL HIGH (ref 11.5–15.5)
WBC: 10.6 10*3/uL — ABNORMAL HIGH (ref 4.0–10.5)
nRBC: 0 % (ref 0.0–0.2)

## 2021-04-19 LAB — BASIC METABOLIC PANEL
Anion gap: 7 (ref 5–15)
BUN: 100 mg/dL — ABNORMAL HIGH (ref 8–23)
CO2: 19 mmol/L — ABNORMAL LOW (ref 22–32)
Calcium: 7.9 mg/dL — ABNORMAL LOW (ref 8.9–10.3)
Chloride: 112 mmol/L — ABNORMAL HIGH (ref 98–111)
Creatinine, Ser: 3.34 mg/dL — ABNORMAL HIGH (ref 0.61–1.24)
GFR, Estimated: 17 mL/min — ABNORMAL LOW (ref >60)
Glucose, Bld: 94 mg/dL (ref 70–99)
Potassium: 5.2 mmol/L — ABNORMAL HIGH (ref 3.5–5.1)
Sodium: 138 mmol/L (ref 135–145)

## 2021-04-19 LAB — GLUCOSE, CAPILLARY
Glucose-Capillary: 91 mg/dL (ref 70–99)
Glucose-Capillary: 85 mg/dL (ref 70–99)
Glucose-Capillary: 68 mg/dL — ABNORMAL LOW (ref 70–99)
Glucose-Capillary: 82 mg/dL (ref 70–99)

## 2021-04-19 SURGERY — IMAGING PROCEDURE, GI TRACT, INTRALUMINAL, VIA CAPSULE

## 2021-04-19 SURGERY — COLONOSCOPY WITH PROPOFOL
Anesthesia: General

## 2021-04-19 MED ORDER — SODIUM CHLORIDE 0.9 % IV SOLN: INTRAVENOUS | Status: DC | PRN

## 2021-04-19 MED ORDER — LIDOCAINE HCL (PF) 2 % IJ SOLN
INTRAMUSCULAR | Status: AC
  Filled 2021-04-19: qty 2

## 2021-04-19 MED ORDER — PHENYLEPHRINE HCL (PRESSORS) 10 MG/ML IV SOLN
INTRAVENOUS | Status: DC | PRN
  Administered 2021-04-19 (×2): 100 ug via INTRAVENOUS

## 2021-04-19 MED ORDER — SODIUM ZIRCONIUM CYCLOSILICATE 10 G PO PACK
10.0000 g | PACK | Freq: Once | ORAL | Status: AC
  Administered 2021-04-19: 10 g via ORAL
  Filled 2021-04-19: qty 1

## 2021-04-19 MED ORDER — PROPOFOL 500 MG/50ML IV EMUL
INTRAVENOUS | Status: AC
  Filled 2021-04-19: qty 50

## 2021-04-19 MED ORDER — EPHEDRINE 5 MG/ML INJ
INTRAVENOUS | Status: AC
  Filled 2021-04-19: qty 10

## 2021-04-19 MED ORDER — EPHEDRINE SULFATE 50 MG/ML IJ SOLN
INTRAMUSCULAR | Status: DC | PRN
  Administered 2021-04-19: 10 mg via INTRAVENOUS

## 2021-04-19 MED ORDER — PROPOFOL 500 MG/50ML IV EMUL
INTRAVENOUS | Status: AC
  Filled 2021-04-19: qty 100

## 2021-04-19 MED ORDER — PROPOFOL 10 MG/ML IV BOLUS
INTRAVENOUS | Status: AC
  Filled 2021-04-19: qty 20

## 2021-04-19 MED ORDER — PROPOFOL 10 MG/ML IV BOLUS
INTRAVENOUS | Status: DC | PRN
  Administered 2021-04-19: 70 mg via INTRAVENOUS

## 2021-04-19 MED ORDER — PROPOFOL 500 MG/50ML IV EMUL
INTRAVENOUS | Status: DC | PRN
  Administered 2021-04-19: 120 ug/kg/min via INTRAVENOUS

## 2021-04-19 MED ORDER — LIDOCAINE HCL (PF) 2 % IJ SOLN
INTRAMUSCULAR | Status: AC
  Filled 2021-04-19: qty 4

## 2021-04-19 MED ORDER — GLYCOPYRROLATE 0.2 MG/ML IJ SOLN
INTRAMUSCULAR | Status: AC
  Filled 2021-04-19: qty 1

## 2021-04-19 NOTE — Progress Notes (Signed)
Vonda Antigua, MD [address] [place], [place], [name], [address] [address], [place], [place], [name], [address] Phone: [phone]  Fax: [phone]   Subjective: Bowel movement documented under flowsheets report black bowel movements even earlier this morning, at 6 AM.  The most recent bowel movement documented at 720 was green.   Objective: Exam: Vital signs in last 24 hours: Vitals:   04/19/21 0500 04/19/21 0600 04/19/21 0840 04/19/21 0848  BP: 118/67 131/60 140/64 134/69  Pulse: (!) 53 70 71 75  Resp: '14 19 20 '$ (!) 22  Temp: 97.8 F (36.6 C)  (!) 96.4 F (35.8 C)   TempSrc:   Temporal Temporal  SpO2: 100% 98% 100% 100%  Weight:      Height:       Weight change: 0.4 kg  Intake/Output Summary (Last 24 hours) at 04/19/2021 0943 Last data filed at 04/19/2021 0600 Gross per 24 hour  Intake 400 ml  Output 1150 ml  Net -750 ml    General: No acute distress, AAO x3 Abd: Soft, NT/ND, No HSM Skin: Warm, no rashes Neck: Supple, Trachea midline   Lab Results: Lab Results  Component Value Date   WBC 10.6 (H) 04/19/2021   HGB 9.3 (L) 04/19/2021   HCT 29.1 (L) 04/19/2021   MCV 98.3 04/19/2021   PLT 209 04/19/2021   Micro Results: Recent Results (from the past 240 hour(s))  Culture, blood (Routine X 2) w Reflex to ID Panel     Status: None (Preliminary result)   Collection Time: 04/15/21  1:51 AM   Specimen: BLOOD  Result Value Ref Range Status   Specimen Description BLOOD BLOOD RIGHT FOREARM  Final   Special Requests   Final    BOTTLES DRAWN AEROBIC AND ANAEROBIC Blood Culture adequate volume   Culture   Final    NO GROWTH 3 DAYS Performed at [facility], [address] [place], [place] [address]    Report Status PENDING  Incomplete  Culture, blood (Routine X 2) w Reflex to ID Panel     Status: None (Preliminary result)   Collection Time: 04/15/21  1:52 AM   Specimen: BLOOD  Result Value Ref Range Status   Specimen Description  BLOOD RIGHT ANTECUBITAL  Final   Special Requests   Final    BOTTLES DRAWN AEROBIC AND ANAEROBIC Blood Culture adequate volume   Culture   Final    NO GROWTH 3 DAYS Performed at [facility], [address] [place], [place] [address]    Report Status PENDING  Incomplete  Urine Culture     Status: None   Collection Time: 04/15/21  3:26 AM   Specimen: Urine, Random  Result Value Ref Range Status   Specimen Description   Final    URINE, RANDOM Performed at [facility], [address], [place], [place] [address]    Special Requests   Final    NONE Performed at [facility], [address], [place], [place] [address]    Culture   Final    NO GROWTH Performed at [facility], [place] [address], [place],  [address]    Report Status 04/16/2021 FINAL  Final  Resp Panel by RT-PCR (Flu A&B, Covid)     Status: None   Collection Time: 04/15/21  4:59 AM   Specimen: Nasopharyngeal(NP) swabs in vial transport medium  Result Value Ref Range Status   SARS Coronavirus 2 by RT PCR NEGATIVE NEGATIVE Final  Comment: (NOTE) SARS-CoV-2 target nucleic acids are NOT DETECTED.  The SARS-CoV-2 RNA is generally detectable in upper respiratory specimens during the acute phase of infection. The lowest concentration of SARS-CoV-2 viral copies this assay can detect is 138 copies/mL. A negative result does not preclude SARS-Cov-2 infection and should not be used as the sole basis for treatment or other patient management decisions. A negative result may occur with  improper specimen collection/handling, submission of specimen other than nasopharyngeal swab, presence of viral mutation(s) within the areas targeted by this assay, and inadequate number of viral copies(<138 copies/mL). A negative result must be combined with clinical observations, patient history, and epidemiological information. The expected result is Negative.  Fact Sheet for Patients:   EntrepreneurPulse.com.au  Fact Sheet for Healthcare Providers:  IncredibleEmployment.be  This test is no t yet approved or cleared by the [place] FDA and  has been authorized for detection and/or diagnosis of SARS-CoV-2 by FDA under an Emergency Use Authorization (EUA). This EUA will remain  in effect (meaning this test can be used) for the duration of the COVID-19 declaration under Section 564(b)(1) of the Act, 21 U.S.C.section 360bbb-3(b)(1), unless the authorization is terminated  or revoked sooner.       Influenza A by PCR NEGATIVE NEGATIVE Final   Influenza B by PCR NEGATIVE NEGATIVE Final    Comment: (NOTE) The Xpert Xpress SARS-CoV-2/FLU/RSV plus assay is intended as an aid in the diagnosis of influenza from Nasopharyngeal swab specimens and should not be used as a sole basis for treatment. Nasal washings and aspirates are unacceptable for Xpert Xpress SARS-CoV-2/FLU/RSV testing.  Fact Sheet for Patients: EntrepreneurPulse.com.au  Fact Sheet for Healthcare Providers: IncredibleEmployment.be  This test is not yet approved or cleared by the [place] FDA and has been authorized for detection and/or diagnosis of SARS-CoV-2 by FDA under an Emergency Use Authorization (EUA). This EUA will remain in effect (meaning this test can be used) for the duration of the COVID-19 declaration under Section 564(b)(1) of the Act, 21 U.S.C. section 360bbb-3(b)(1), unless the authorization is terminated or revoked.  Performed at [facility], [address] [place], [place] [address]   MRSA PCR Screening     Status: Abnormal   Collection Time: 04/15/21  6:13 AM   Specimen: Nasopharyngeal  Result Value Ref Range Status   MRSA by PCR POSITIVE (A) NEGATIVE Final    Comment:        The GeneXpert MRSA Assay (FDA approved for NASAL specimens only), is one component of a comprehensive MRSA  colonization surveillance program. It is not intended to diagnose MRSA infection nor to guide or monitor treatment for MRSA infections. RESULT CALLED TO, READ BACK BY AND VERIFIED WITH: [facility] FURR AT [id number] 04/15/21.PMF Performed at [facility], [address] [place], [place] [address]   KOH prep     Status: None   Collection Time: 04/18/21 12:46 PM   Specimen: Esophagus  Result Value Ref Range Status   Specimen Description ESOPHAGUS  Final   Special Requests Normal  Final   KOH Prep   Final    YEAST WITH PSEUDOHYPHAE Performed at [facility], [address] [place], [place] [address]    Report Status 04/18/2021 FINAL  Final   Studies/Results: No results found. Medications:  Scheduled Meds: . [MAR Hold] amiodarone  400 mg Oral BID  . [MAR Hold] aspirin EC  81 mg Oral Daily  . [MAR Hold] atorvastatin  40 mg Oral Daily  . [facility]  Hold] Chlorhexidine Gluconate Cloth  6 each Topical Daily  . [MAR Hold] feeding supplement  237 mL Oral BID BM  . [MAR Hold] folic acid  1 mg Oral Daily  . [MAR Hold] melatonin  20 mg Oral QHS  . [MAR Hold] metoprolol tartrate  12.5 mg Oral BID  . [MAR Hold] mupirocin ointment  1 application Nasal BID  . [MAR Hold] pantoprazole  40 mg Intravenous Q12H  . [MAR Hold] polyethylene glycol  17 g Oral Daily  . [MAR Hold] polyethylene glycol-electrolytes  4,000 mL Oral Once  . [MAR Hold] thiamine  100 mg Oral Daily   Continuous Infusions: PRN Meds:.[MAR Hold] chlorpheniramine-HYDROcodone, [MAR Hold] docusate sodium, [MAR Hold] HYDROcodone-acetaminophen, [MAR Hold] ipratropium-albuterol, [MAR Hold] metoprolol tartrate, [MAR Hold] polyethylene glycol   Assessment: Principal Problem:   Acute blood loss anemia Active Problems:   GIB (gastrointestinal bleeding)   Acute kidney injury superimposed on CKD (HCC)   NSTEMI (non-ST elevated myocardial infarction) ([place])   Melena   Hiatal hernia   Duodenal erythema    Plan: Given ongoing  melena proceed with upper endoscopy for further evaluation of melena and anemia  As per Endo unit staff, capsule study is not available today and earliest availability would be tomorrow.  Therefore, if colonoscopy today does not show source of melena and anemia, capsule study can be administered earliest tomorrow  I have discussed alternative options, risks & benefits,  which include, but are not limited to, bleeding, infection, perforation,respiratory complication & drug reaction.  The patient agrees with this plan & written consent will be obtained.       LOS: 4 days   Vonda Antigua, MD 04/19/2021, 9:43 AM

## 2021-04-19 NOTE — Op Note (Signed)
[facility] Patient Name: Gabriel King Procedure Date: 04/19/2021 9:32 AM MRN: [id number] Account #: [id number] Date of Birth: 1932/04/11 Admit Type: Outpatient Age: 87 Room: [facility] ENDO ROOM 3 Gender: Male Note Status: Finalized Procedure:             Colonoscopy Indications:           Melena, Iron deficiency anemia Providers:             Sinead Hockman B. Bonna Gains MD, MD Referring MD:          Forest Gleason Md, MD (Referring MD) Medicines:             Monitored Anesthesia Care Complications:         No immediate complications. Procedure:             Pre-Anesthesia Assessment:                        - Prior to the procedure, a History and Physical was                         performed, and patient medications, allergies and                         sensitivities were reviewed. The patient's tolerance                         of previous anesthesia was reviewed.                        - The risks and benefits of the procedure and the                         sedation options and risks were discussed with the                         patient. All questions were answered and informed                         consent was obtained.                        - Patient identification and proposed procedure were                         verified prior to the procedure by the physician, the                         nurse, the anesthesiologist, the anesthetist and the                         technician. The procedure was verified in the                         pre-procedure area in the procedure room in the                         endoscopy suite.                        - Prophylactic Antibiotics: The  patient does not                         require prophylactic antibiotics.                        - ASA Grade Assessment: III - A patient with severe                         systemic disease.                        - After reviewing the risks and benefits, the patient                          was deemed in satisfactory condition to undergo the                         procedure.                        - Monitored anesthesia care was determined to be                         medically necessary for this procedure based on review                         of the patient's medical history, medications, and                         prior anesthesia history.                        - The anesthesia plan was to use monitored anesthesia                         care (MAC).                        After obtaining informed consent, the colonoscope was                         passed under direct vision. Throughout the procedure,                         the patient's blood pressure, pulse, and oxygen                         saturations were monitored continuously. The                         Colonoscope was introduced through the anus and                         advanced to the the terminal ileum. The colonoscopy                         was performed with ease. The patient tolerated the                         procedure well.  The quality of the bowel preparation                         was poor. Findings:      The perianal and digital rectal examinations were normal.      Many sessile, non-bleeding polyps were found in the sigmoid colon and       transverse colon. The polyps were 4 to 7 mm in size. These were not       removed due to recent bleeding and anemia on this admission, to prevent       confounding the clinical picture.      Multiple small and large-mouthed diverticula were found in the entire       colon.      Hematin (altered blood/coffee-ground-like material) was found in the       entire colon. Balls of black stool were seen throughout the colon,       especially around diverticuli, suggesting old blood.      The exam was otherwise without abnormality.      Non-bleeding internal hemorrhoids were found during retroflexion. Impression:            - Preparation of the  colon was poor.                        - Many 4 to 7 mm, non-bleeding polyps in the sigmoid                         colon and in the transverse colon.                        - Diverticulosis in the entire examined colon.                        - Blood in the entire examined colon.                        - The examination was otherwise normal.                        - Non-bleeding internal hemorrhoids.                        - No specimens collected.                        - The absence of red blood suggests that there is no                         active bleeding. I do not suspect his symptoms to be                         from resolved diverticular bleeding given that there                         is no old red blood that would typically be expected                         in the colon even if diverticular bleeding has  resolved. The presence of black stool from the cecum                         to the sigmoid colon suggests that he may have had                         bleeding from a more proximal source that has resolved                         at this time. Terminal ileum did not show any blood.                         Small bowel capsule study is indicated for evaluation                         of melena and acute anemia and need for                         anticoagulation/antithrombotic medical therapy for                         NSTEMI. Recommendation:        - To visualize the small bowel, perform video capsule                         endoscopy today.                        - Capsule study will be administered today as endo                         staff was able to borrow it from an outside source.                         Keep Patient NPO and instructions for diet for the                         rest of the day after capsule is administered will be                         discussed by administering Endo nurse with the                         patient's ICU  nurse later today.                        - Continue Serial CBCs and transfuse PRN                        - Return patient to hospital ward for ongoing care.                        - The findings and recommendations were discussed with                         the patient.                        -  Return to my office to discuss future colonoscopy                         for removal of polyps vs conservative management. Procedure Code(s):     --- Professional ---                        [phone], Colonoscopy, flexible; diagnostic, including                         collection of specimen(s) by brushing or washing, when                         performed (separate procedure) Diagnosis Code(s):     --- Professional ---                        K63.5, Polyp of colon                        K92.1, Melena (includes Hematochezia)                        D50.9, Iron deficiency anemia, unspecified CPT copyright 2019 American Medical Association. All rights reserved. The codes documented in this report are preliminary and upon coder review may  be revised to meet current compliance requirements.  Vonda Antigua, MD Margretta Sidle B. Bonna Gains MD, MD 04/19/2021 10:37:13 AM This report has been signed electronically. Number of Addenda: 0 Note Initiated On: 04/19/2021 9:32 AM Scope Withdrawal Time: 0 hours 15 minutes 25 seconds  Total Procedure Duration: 0 hours 19 minutes 57 seconds  Estimated Blood Loss:  Estimated blood loss: none.      [facility]

## 2021-04-19 NOTE — Progress Notes (Signed)
[place] at [place] NAME: Gabriel King    MR#:  [id number]  DATE OF BIRTH:  10/08/1932  SUBJECTIVE:  just got back from colonoscopy. Had gas pains initially. Denies any chest pain at present. Wife at bedside. Patient appreciated received here at [facility]. No vomiting or blood he stools. REVIEW OF SYSTEMS:   Review of Systems  Constitutional: Negative for chills, fever and weight loss.  HENT: Negative for ear discharge, ear pain and nosebleeds.   Eyes: Negative for blurred vision, pain and discharge.  Respiratory: Negative for sputum production, shortness of breath, wheezing and stridor.   Cardiovascular: Negative for chest pain, palpitations, orthopnea and PND.  Gastrointestinal: Positive for heartburn. Negative for abdominal pain, diarrhea, nausea and vomiting.  Genitourinary: Negative for frequency and urgency.  Musculoskeletal: Negative for back pain and joint pain.  Neurological: Positive for weakness. Negative for sensory change, speech change and focal weakness.  Psychiatric/Behavioral: Negative for depression and hallucinations. The patient is not nervous/anxious.    Tolerating Diet: Tolerating PT:   DRUG ALLERGIES:   Allergies  Allergen Reactions  . Bactrim [Sulfamethoxazole-Trimethoprim]   . Cefpodoxime Swelling  . Ciprofloxacin   . Ertapenem Anxiety and Other (See Comments)    VITALS:  Blood pressure (!) 159/59, pulse 82, temperature (!) 96.4 F (35.8 C), temperature source Temporal, resp. rate 19, height '5\' 11"'$  (1.803 m), weight 80.9 kg, SpO2 99 %.  PHYSICAL EXAMINATION:   Physical Exam  GENERAL:  87 y.o.-year-old patient lying in the bed with no acute distress.  LUNGS: Normal breath sounds bilaterally, no wheezing, rales, rhonchi. No use of accessory muscles of respiration.  CARDIOVASCULAR: S1, S2 normal. No murmurs, rubs, or gallops.  ABDOMEN: Soft, nontender, nondistended. Bowel sounds present. No organomegaly or  mass.  EXTREMITIES: No cyanosis, clubbing or edema b/l.    NEUROLOGIC: Cranial nerves II through XII are intact. No focal Motor or sensory deficits b/l.   PSYCHIATRIC:  patient is alert and oriented x 3.  SKIN: No obvious rash, lesion, or ulcer.   LABORATORY PANEL:  CBC Recent Labs  Lab 04/19/21 0412  WBC 10.6*  HGB 9.3*  HCT 29.1*  PLT 209    Chemistries  Recent Labs  Lab 04/18/21 0427 04/19/21 0412  NA 135 138  K 4.8 5.2*  CL 109 112*  CO2 19* 19*  GLUCOSE 104* 94  BUN 114* 100*  CREATININE 3.49* 3.34*  CALCIUM 7.8* 7.9*  MG 1.8  --   AST 20  --   ALT 14  --   ALKPHOS 79  --   BILITOT 0.8  --    Cardiac Enzymes No results for input(s): TROPONINI in the last 168 hours. RADIOLOGY:  No results found. ASSESSMENT AND PLAN:   87 year old male with history of CAD, multiple PCI and stents, chronic CHF, COPD, former smoker, CKD 4, paroxysmal atrial fibrillation on Eliquis, resident of [place], currently visiting [place] for his grandsons graduation/church celebration, presented to the emergency room 5/14 with chest pain, worsening dyspnea on exertion for few weeks, melena. In the ER he was noted to have a hemoglobin of 4, creatinine of 3.7, high-sensitivity Troponin of 10,000.  G.I. bleed-- etiology unclear severe acute blood loss anemia -- patient presented with increasing shortness of breath weakness hemoglobin of 4.0--- four unit blood transfusion-- 9.3 -- EGD White nummular lesions in esophageal mucosa.  Brushings performed.                        - Non-obstructing Schatzki ring.                        - Medium-sized hiatal hernia.                        - Erythematous duodenopathy. -- Colonoscopy - Preparation of the colon was poor.                        - Many 4 to 7 mm, non-bleeding polyps in the sigmoid                         colon and in the transverse colon.                        - Diverticulosis in the entire examined  colon.                        - Blood in the entire examined colon.                        - The examination was otherwise normal.                        - Non-bleeding internal hemorrhoids. -- G.I. Dr. Bonna Gains recommends capsule endoscopy which is being performed.  NSTEMI In the setting of G.I. bleed -- high-sensitivity troponin [place] 10,000-- patient has history of CAD with PCI to diagonal in 2011. Follows with cardiology in [place]. -- Per [facility] cardiology continue aspirin, Lipitor 40 mg daily.-- Cardiology does not recommend left heart cath due to active bleeding -- stable blood pressure  Paroxysmal atrial fibrillation -- maintaining sinus rhythm -- continue amiodarone 400 mg BID -- holding eliquis due to G.I. bleed  COPD -- stable  Alcohol use -- no signs of withdrawal     Procedures: Family communication : wife at bedside Consults : cardiology, G.I. CODE STATUS: DNR DVT Prophylaxis : SCD Level of care: Med-Surg Status is: Inpatient  Remains inpatient appropriate because:Inpatient level of care appropriate due to severity of illness   Dispo: The patient is from: Home              Anticipated d/c is to: Home              Patient currently is not medically stable to d/c.   Difficult to place patient No  Monitoring for another day for G.I. bleed. PT to see patient. Continues to show improvement likely discharge tomorrow.      TOTAL TIME TAKING CARE OF THIS PATIENT: 30 minutes.  >50% time spent on counselling and coordination of care  Note: This dictation was prepared with Dragon dictation along with smaller phrase technology. Any transcriptional errors that result from this process are unintentional.  [name] M.D    [facility]   CC: Primary care physician; System, Provider Not InPatient ID: Gabriel King, male   DOB: 1932-09-11, 87 y.o.   MRN: [id number]

## 2021-04-19 NOTE — Progress Notes (Signed)
Notified family member Santiago Glad regarding procedure results and to not have anything by mouth for now.  awaiting recorder from [facility] for the capsule study this afternoon.

## 2021-04-19 NOTE — Anesthesia Postprocedure Evaluation (Signed)
Anesthesia Post Note  Patient: Gabriel King  Procedure(s) Performed: COLONOSCOPY WITH PROPOFOL (N/A ) GIVENS CAPSULE STUDY (N/A )  Patient location during evaluation: Endoscopy Anesthesia Type: General Level of consciousness: awake and alert and oriented Pain management: pain level controlled Vital Signs Assessment: post-procedure vital signs reviewed and stable Respiratory status: spontaneous breathing, nonlabored ventilation and respiratory function stable Cardiovascular status: blood pressure returned to baseline and stable Postop Assessment: no signs of nausea or vomiting Anesthetic complications: no   No complications documented.   Last Vitals:  Vitals:   04/19/21 1043 04/19/21 1053  BP: (!) 123/49 (!) 159/59  Pulse: 73 82  Resp: 20 19  Temp:    SpO2: 100% 99%    Last Pain:  Vitals:   04/19/21 1053  TempSrc:   PainSc: 0-No pain                 [name]

## 2021-04-19 NOTE — Transfer of Care (Signed)
Immediate Anesthesia Transfer of Care Note  Patient: Gabriel King  Procedure(s) Performed: COLONOSCOPY WITH PROPOFOL (N/A ) GIVENS CAPSULE STUDY (N/A )  Patient Location: PACU  Anesthesia Type:General  Level of Consciousness: sedated  Airway & Oxygen Therapy: Patient Spontanous Breathing  Post-op Assessment: Report given to RN and Post -op Vital signs reviewed and stable  Post vital signs: Reviewed and stable  Last Vitals:  Vitals Value Taken Time  BP 105/50 04/19/21 1026  Temp    Pulse 68 04/19/21 1028  Resp 15 04/19/21 1028  SpO2 98 % 04/19/21 1028  Vitals shown include unvalidated device data.  Last Pain:  Vitals:   04/19/21 1026  TempSrc:   PainSc: Asleep      Patients Stated Pain Goal: 0 ([id number])  Complications: No complications documented.

## 2021-04-19 NOTE — Progress Notes (Signed)
Physical Therapy Cancellation Note:  Pt currently off floor for procedure.  Will re-attempt PT evaluation at a later date/time as medically appropriate.  Leitha Bleak, PT 04/19/21, 9:25 AM

## 2021-04-19 NOTE — Progress Notes (Signed)
OT Cancellation Note  Patient Details Name: Gabriel King MRN: [id number] DOB: [birth year]   Cancelled Treatment:    Reason Eval/Treat Not Completed: Patient at procedure or test/ unavailable. OT order received, chart reviewed. Pt noted to OTF for procedure. Will re-attempt at a later date/time as available and pt medically appropriate.   Shara Blazing, M.S., OTR/L Ascom: [phone] 04/19/21, 9:40 AM

## 2021-04-19 NOTE — Progress Notes (Signed)
Progress Note  Patient Name: Gabriel King Date of Encounter: 04/19/2021  [facility] Cardiologist: Cardiologist in [place]   Patient feels tired, just came back from endoscopy suite.  He endorsed having black stools over 3-week period prior to admission.  Inpatient Medications    Scheduled Meds: . amiodarone  400 mg Oral BID  . aspirin EC  81 mg Oral Daily  . atorvastatin  40 mg Oral Daily  . Chlorhexidine Gluconate Cloth  6 each Topical Daily  . feeding supplement  237 mL Oral BID BM  . folic acid  1 mg Oral Daily  . melatonin  20 mg Oral QHS  . metoprolol tartrate  12.5 mg Oral BID  . mupirocin ointment  1 application Nasal BID  . pantoprazole  40 mg Intravenous Q12H  . polyethylene glycol  17 g Oral Daily  . polyethylene glycol-electrolytes  4,000 mL Oral Once  . sodium zirconium cyclosilicate  10 g Oral Once  . thiamine  100 mg Oral Daily   Continuous Infusions:  PRN Meds: chlorpheniramine-HYDROcodone, docusate sodium, HYDROcodone-acetaminophen, ipratropium-albuterol, metoprolol tartrate, polyethylene glycol   Vital Signs    Vitals:   04/19/21 1026 04/19/21 1033 04/19/21 1043 04/19/21 1053  BP: (!) 105/50 (!) 108/38 (!) 123/49 (!) 159/59  Pulse: 63 74 73 82  Resp: '14 20 20 19  '$ Temp:      TempSrc:      SpO2: 98% 98% 100% 99%  Weight:      Height:        Intake/Output Summary (Last 24 hours) at 04/19/2021 1320 Last data filed at 04/19/2021 0600 Gross per 24 hour  Intake --  Output 1150 ml  Net -1150 ml   Last 3 Weights 04/19/2021 04/18/2021 04/17/2021  Weight (lbs) 178 lb 5.6 oz 177 lb 7.5 oz 177 lb 0.5 oz  Weight (kg) 80.9 kg 80.5 kg 80.3 kg      Telemetry    Sinus rhythm- Personally Reviewed  ECG    No new tracing- Personally Reviewed  Physical Exam   GEN: No acute distress, soft-spoken, appears weak Neck: No JVD Cardiac: RRR, no murmurs, rubs, or gallops.  Respiratory:  Diminished breath sounds at bases GI: Soft, nontender,  non-distended  MS: No edema; No deformity. Neuro:  Nonfocal  Psych: Normal affect   Labs    High Sensitivity Troponin:   Recent Labs  Lab 04/15/21 0310 04/15/21 0752 04/15/21 1042 04/16/21 1023 04/16/21 1216  TROPONINIHS 535* 6,671* 10,535* 7,737* 7,692*      Chemistry Recent Labs  Lab 04/15/21 0111 04/15/21 0752 04/17/21 0313 04/18/21 0427 04/19/21 0412  NA 136   < > 138 135 138  K 5.7*   < > 4.7 4.8 5.2*  CL 107   < > 111 109 112*  CO2 14*   < > 20* 19* 19*  GLUCOSE 135*   < > 124* 104* 94  BUN 163*   < > 132* 114* 100*  CREATININE 3.74*   < > 3.39* 3.49* 3.34*  CALCIUM 8.6*   < > 8.1* 7.8* 7.9*  PROT 4.8*  --   --  4.3*  --   ALBUMIN 2.8*  --   --  2.2*  --   AST 31  --   --  20  --   ALT 15  --   --  14  --   ALKPHOS 85  --   --  79  --   BILITOT 0.7  --   --  0.8  --   GFRNONAA 15*   < > 17* 16* 17*  ANIONGAP 15   < > '7 7 7   '$ < > = values in this interval not displayed.     Hematology Recent Labs  Lab 04/17/21 0313 04/18/21 0427 04/19/21 0412  WBC 13.4* 12.3* 10.6*  RBC 2.72* 2.59* 2.96*  HGB 8.6* 8.2* 9.3*  HCT 26.4* 25.7* 29.1*  MCV 97.1 99.2 98.3  MCH 31.6 31.7 31.4  MCHC 32.6 31.9 32.0  RDW 21.1* 21.2* 21.2*  PLT 208 188 209    BNP Recent Labs  Lab 04/15/21 0111 04/15/21 1442  BNP 442.8* 650.6*     DDimer No results for input(s): DDIMER in the last 168 hours.   Radiology    No results found.  Cardiac Studies   Echo 04/2021 1. Left ventricular ejection fraction, by estimation, is 45 to 50%. The  left ventricle has mildly decreased function. The left ventricle  demonstrates regional wall motion abnormalities (Hypokinesis of the mid to  distal anterior and anteroseptal and  apical region). There is mild left ventricular hypertrophy. Left  ventricular diastolic parameters are indeterminate.  2. Right ventricular systolic function is normal. The right ventricular  size is normal. There is normal pulmonary artery systolic  pressure. The  estimated right ventricular systolic pressure is [id number] mmHg.  3. Left atrial size was mildly dilated.  4. The inferior vena cava is dilated in size with >50% respiratory  variability, suggesting right atrial pressure of 8 mmHg.   Patient Profile     87 y.o. male with history of CAD/PCI to diagonal 2011, paroxysmal A. fib on Eliquis, CKD 4, presenting with profound weakness.  Diagnosed with acute blood loss anemia likely GI etiology, and NSTEMI.  Assessment & Plan    1.  NSTEMI, history of CAD, PCI to diagonal in 2011 -Denies chest pain -Troponins peaked at 10535 -Aspirin, Lipitor 40 mg daily -Left heart cath not performed due to GI bleed and anemia requiring blood transfusions. -Echo with mildly reduced EF 45 to 50%. -Stable hemodynamically  2.  Paroxysmal A. Fib -Maintaining sinus rhythm -Continue amiodarone 400 mg twice daily -Holding Eliquis due to GI bleed  3.  Anemia, GI bleed -EGD, colonoscopy, capsule endoscopy as per GI  Total encounter time 35 minutes  Greater than 50% was spent in counseling and coordination of care with the patient      Signed, Kate Sable, MD  04/19/2021, 1:20 PM

## 2021-04-19 NOTE — Anesthesia Preprocedure Evaluation (Signed)
Anesthesia Evaluation  Patient identified by MRN, date of birth, ID band Patient awake    Reviewed: Allergy & Precautions, NPO status , Patient's Chart, lab work & pertinent test results  History of Anesthesia Complications Negative for: history of anesthetic complications  Airway Mallampati: III  TM Distance: >3 FB Neck ROM: Full    Dental  (+) Upper Dentures, Lower Dentures   Pulmonary neg sleep apnea, COPD, former smoker,    breath sounds clear to auscultation- rhonchi (-) wheezing      Cardiovascular hypertension, + CAD, + Past MI and +CHF  (-) Cardiac Stents and (-) CABG  Rhythm:Regular Rate:Normal - Systolic murmurs and - Diastolic murmurs    Neuro/Psych neg Seizures negative neurological ROS  negative psych ROS   GI/Hepatic Neg liver ROS, hiatal hernia,   Endo/Other  negative endocrine ROSneg diabetes  Renal/GU ARF and CRFRenal disease     Musculoskeletal negative musculoskeletal ROS (+)   Abdominal (+) - obese,   Peds  Hematology  (+) anemia ,   Anesthesia Other Findings Past Medical History: No date: CHF (congestive heart failure) (HCC) No date: CKD (chronic kidney disease) No date: COPD (chronic obstructive pulmonary disease) (HCC) No date: Coronary artery disease 01/2021: COVID-19 No date: Hyperlipidemia No date: Hypertension   Reproductive/Obstetrics                             Anesthesia Physical Anesthesia Plan  ASA: III  Anesthesia Plan: General   Post-op Pain Management:    Induction: Intravenous  PONV Risk Score and Plan: 1 and Propofol infusion  Airway Management Planned: Natural Airway  Additional Equipment:   Intra-op Plan:   Post-operative Plan:   Informed Consent: I have reviewed the patients History and Physical, chart, labs and discussed the procedure including the risks, benefits and alternatives for the proposed anesthesia with the patient or  authorized representative who has indicated his/her understanding and acceptance.     Dental advisory given  Plan Discussed with: CRNA and Anesthesiologist  Anesthesia Plan Comments:         Anesthesia Quick Evaluation

## 2021-04-19 NOTE — Progress Notes (Signed)
PT Cancellation Note  Patient Details Name: Gabriel King MRN: [id number] DOB: [birth year]   Cancelled Treatment:    Reason Eval/Treat Not Completed: Other (comment).  Per discussion with pt's nurse, will hold therapy this afternoon (pt sleepy this afternoon and currently having capsule study being performed).  Will re-attempt PT evaluation tomorrow.  Leitha Bleak, PT 04/19/21, 2:47 PM

## 2021-04-19 NOTE — Plan of Care (Signed)

## 2021-04-20 ENCOUNTER — Encounter: Payer: Self-pay | Admitting: Gastroenterology

## 2021-04-20 DIAGNOSIS — N179 Acute kidney failure, unspecified: Secondary | ICD-10-CM

## 2021-04-20 DIAGNOSIS — D509 Iron deficiency anemia, unspecified: Secondary | ICD-10-CM

## 2021-04-20 DIAGNOSIS — K921 Melena: Secondary | ICD-10-CM | POA: Diagnosis not present

## 2021-04-20 DIAGNOSIS — R079 Chest pain, unspecified: Secondary | ICD-10-CM

## 2021-04-20 DIAGNOSIS — I480 Paroxysmal atrial fibrillation: Secondary | ICD-10-CM

## 2021-04-20 DIAGNOSIS — I214 Non-ST elevation (NSTEMI) myocardial infarction: Secondary | ICD-10-CM | POA: Diagnosis not present

## 2021-04-20 DIAGNOSIS — N1832 Chronic kidney disease, stage 3b: Secondary | ICD-10-CM

## 2021-04-20 DIAGNOSIS — I25118 Atherosclerotic heart disease of native coronary artery with other forms of angina pectoris: Secondary | ICD-10-CM

## 2021-04-20 DIAGNOSIS — D62 Acute posthemorrhagic anemia: Secondary | ICD-10-CM | POA: Diagnosis not present

## 2021-04-20 LAB — CULTURE, BLOOD (ROUTINE X 2)
Culture: NO GROWTH
Culture: NO GROWTH
Special Requests: ADEQUATE
Special Requests: ADEQUATE

## 2021-04-20 LAB — BASIC METABOLIC PANEL
Anion gap: 7 (ref 5–15)
BUN: 84 mg/dL — ABNORMAL HIGH (ref 8–23)
CO2: 17 mmol/L — ABNORMAL LOW (ref 22–32)
Calcium: 7.6 mg/dL — ABNORMAL LOW (ref 8.9–10.3)
Chloride: 116 mmol/L — ABNORMAL HIGH (ref 98–111)
Creatinine, Ser: 3.06 mg/dL — ABNORMAL HIGH (ref 0.61–1.24)
GFR, Estimated: 19 mL/min — ABNORMAL LOW (ref >60)
Glucose, Bld: 92 mg/dL (ref 70–99)
Potassium: 5.2 mmol/L — ABNORMAL HIGH (ref 3.5–5.1)
Sodium: 140 mmol/L (ref 135–145)

## 2021-04-20 LAB — GLUCOSE, CAPILLARY: Glucose-Capillary: 95 mg/dL (ref 70–99)

## 2021-04-20 MED ORDER — PANTOPRAZOLE SODIUM 40 MG PO TBEC
40.0000 mg | DELAYED_RELEASE_TABLET | Freq: Every day | ORAL | Status: DC
  Administered 2021-04-20 – 2021-04-24 (×5): 40 mg via ORAL
  Filled 2021-04-20 (×5): qty 1

## 2021-04-20 MED ORDER — COLLAGENASE 250 UNIT/GM EX OINT
TOPICAL_OINTMENT | Freq: Every day | CUTANEOUS | Status: DC
  Filled 2021-04-20: qty 30

## 2021-04-20 MED ORDER — APIXABAN 2.5 MG PO TABS
2.5000 mg | ORAL_TABLET | Freq: Two times a day (BID) | ORAL | Status: DC
  Administered 2021-04-20 – 2021-04-21 (×3): 2.5 mg via ORAL
  Filled 2021-04-20 (×3): qty 1

## 2021-04-20 MED ORDER — SODIUM ZIRCONIUM CYCLOSILICATE 10 G PO PACK
10.0000 g | PACK | Freq: Once | ORAL | Status: AC
  Administered 2021-04-20: 10 g via ORAL
  Filled 2021-04-20: qty 1

## 2021-04-20 NOTE — Progress Notes (Addendum)
[facility] at [place] NAME: Gabriel King    MR#:  [id number]  DATE OF BIRTH:  1932-03-17  SUBJECTIVE:  patient feelings better today. He had low twitching pain around the left chest earlier. Thanks he thinks this is gasping. Tolerating PO diet. Wife at bedside. No bloody stool noted.  REVIEW OF SYSTEMS:   Review of Systems  Constitutional: Negative for chills, fever and weight loss.  HENT: Negative for ear discharge, ear pain and nosebleeds.   Eyes: Negative for blurred vision, pain and discharge.  Respiratory: Negative for sputum production, shortness of breath, wheezing and stridor.   Cardiovascular: Negative for chest pain, palpitations, orthopnea and PND.  Gastrointestinal: Positive for heartburn. Negative for abdominal pain, diarrhea, nausea and vomiting.  Genitourinary: Negative for frequency and urgency.  Musculoskeletal: Negative for back pain and joint pain.  Neurological: Positive for weakness. Negative for sensory change, speech change and focal weakness.  Psychiatric/Behavioral: Negative for depression and hallucinations. The patient is not nervous/anxious.    Tolerating Diet:yes Tolerating PT: rehab  DRUG ALLERGIES:   Allergies  Allergen Reactions  . Bactrim [Sulfamethoxazole-Trimethoprim]   . Cefpodoxime Swelling  . Ciprofloxacin   . Ertapenem Anxiety and Other (See Comments)    VITALS:  Blood pressure (!) 144/78, pulse 73, temperature 97.6 F (36.4 C), temperature source Oral, resp. rate 18, height '5\' 11"'$  (1.803 m), weight 82.4 kg, SpO2 100 %.  PHYSICAL EXAMINATION:   Physical Exam  GENERAL:  87 y.o.-year-old patient lying in the bed with no acute distress.  LUNGS: Normal breath sounds bilaterally, no wheezing, rales, rhonchi. No use of accessory muscles of respiration.  CARDIOVASCULAR: S1, S2 normal. No murmurs, rubs, or gallops.  ABDOMEN: Soft, nontender, nondistended. Bowel sounds present. No organomegaly or  mass.  EXTREMITIES: No cyanosis, clubbing or edema b/l.    NEUROLOGIC: Cranial nerves II through XII are intact. No focal Motor or sensory deficits b/l.   PSYCHIATRIC:  patient is alert and oriented x 3.  SKIN: No obvious rash, lesion, or ulcer.   LABORATORY PANEL:  CBC Recent Labs  Lab 04/19/21 0412  WBC 10.6*  HGB 9.3*  HCT 29.1*  PLT 209    Chemistries  Recent Labs  Lab 04/18/21 0427 04/19/21 0412 04/20/21 0339  NA 135   < > 140  K 4.8   < > 5.2*  CL 109   < > 116*  CO2 19*   < > 17*  GLUCOSE 104*   < > 92  BUN 114*   < > 84*  CREATININE 3.49*   < > 3.06*  CALCIUM 7.8*   < > 7.6*  MG 1.8  --   --   AST 20  --   --   ALT 14  --   --   ALKPHOS 79  --   --   BILITOT 0.8  --   --    < > = values in this interval not displayed.   Cardiac Enzymes No results for input(s): TROPONINI in the last 168 hours. RADIOLOGY:  No results found. ASSESSMENT AND PLAN:   87 year old male with history of CAD, multiple PCI and stents, chronic CHF, COPD, former smoker, CKD 4, paroxysmal atrial fibrillation on Eliquis, resident of [place], currently visiting [place] for his grandsons graduation/church celebration, presented to the emergency room 5/14 with chest pain, worsening dyspnea on exertion for few weeks, melena. In the ER he was noted to have a hemoglobin of 4, creatinine  of 3.7, high-sensitivity Troponin of 10,000.  G.I. bleed-- etiology unclear severe acute blood loss anemia -- patient presented with increasing shortness of breath weakness hemoglobin of 4.0--- four unit blood transfusion-- 9.3 -- EGD White nummular lesions in esophageal mucosa.                         Brushings performed.                        - Non-obstructing Schatzki ring.                        - Medium-sized hiatal hernia.                        - Erythematous duodenopathy. -- Colonoscopy - Preparation of the colon was poor.                        - Many 4 to 7 mm, non-bleeding polyps in the  sigmoid                         colon and in the transverse colon.                        - Diverticulosis in the entire examined colon.                        - Blood in the entire examined colon.                        - The examination was otherwise normal.                        - Non-bleeding internal hemorrhoids. -- G.I. Dr. Bonna Gains recommends capsule endoscopy which is being performed. --capsule endoscopy--inconclusive  NSTEMI In the setting of G.I. bleed -- high-sensitivity troponin peaked 10,000-- patient has history of CAD with PCI to diagonal in 2011. Follows with cardiology in [place]. -- Per [facility] cardiology continue aspirin, Lipitor 40 mg daily.-- Cardiology does not recommend left heart cath due to active bleeding -- stable blood pressure  Paroxysmal atrial fibrillation -- maintaining sinus rhythm --on  amiodarone 400 mg BID -- holding eliquis due to G.I. bleed   Addendum-- discussed with Dr. Bonna Gains. Since no obvious source of G.I. bleed found will start patient on eliquis while he is in-house to ensure hemoglobin remains stable.  COPD -- stable  H/o Alcohol use -- no signs of withdrawal  Generalized weakness with deconditioning -- PT recommends rehab. TOC for discharge planning  Procedures: Family communication : wife at bedside Consults : cardiology, G.I. CODE STATUS: DNR DVT Prophylaxis : SCD Level of care: Med-Surg Status is: Inpatient  Remains inpatient appropriate because:Inpatient level of care appropriate due to severity of illness   Dispo: The patient is from: Home              Anticipated d/c is to: Home              Patient currently medically optimized   Difficult to place patient No  Will discharged to rehab once bed available. Patient and wife agreeable.     TOTAL TIME TAKING CARE OF THIS PATIENT: 25 minutes.  >50% time spent  on counselling and coordination of care  Note: This dictation was prepared with Dragon dictation along  with smaller phrase technology. Any transcriptional errors that result from this process are unintentional.  Gabriel King M.D    [facility]   CC: Primary care physician; System, Provider Not InPatient ID: Gabriel King, male   DOB: 12-23-1931, 87 y.o.   MRN: [id number]

## 2021-04-20 NOTE — TOC Initial Note (Signed)
Transition of Care [facility]) - Initial/Assessment Note    Patient Details  Name: Gabriel King MRN: [id number] Date of Birth: 07-20-1932  Transition of Care [facility]) CM/SW Contact:    Eileen Stanford, LCSW Phone Number: 04/20/2021, 12:33 PM  Clinical Narrative:     CSW spoke with pt at bedside. Pt's spouse present. Pt's spouse states they were here visiting from [place] with plans to return after pt gets rehab. Pt's spouse and pt are not familiar with any SNFS except [facility] as they have had family work there. CSW has sent referral to [place] facilities.              Expected Discharge Plan: Skilled Nursing Facility Barriers to Discharge: Continued Medical Work up   Patient Goals and CMS Choice Patient states their goals for this hospitalization and ongoing recovery are:: for pt to go to rehab prior to return to [facility]   Choice offered to / list presented to : Patient  Expected Discharge Plan and Services Expected Discharge Plan: [place] In-house Referral: Clinical Social Work   Post Acute Care Choice: [place] Living arrangements for the past 2 months: [place]                                      Prior Living Arrangements/Services Living arrangements for the past 2 months: Single Family Home Lives with:: Spouse Patient language and need for interpreter reviewed:: Yes Do you feel safe going back to the place where you live?: Yes      Need for Family Participation in Patient Care: Yes (Comment) Care giver support system in place?: Yes (comment)   Criminal Activity/Legal Involvement Pertinent to Current Situation/Hospitalization: No - Comment as needed  Activities of Daily Living Home Assistive Devices/Equipment: None ADL Screening (condition at time of admission) Patient's cognitive ability adequate to safely complete daily activities?: Yes Is the patient deaf or have difficulty hearing?: Yes Does the patient have  difficulty seeing, even when wearing glasses/contacts?: No Does the patient have difficulty concentrating, remembering, or making decisions?: No Patient able to express need for assistance with ADLs?: Yes Does the patient have difficulty dressing or bathing?: No Independently performs ADLs?: Yes (appropriate for developmental age) Does the patient have difficulty walking or climbing stairs?: No Weakness of Legs: None Weakness of Arms/Hands: None  Permission Sought/Granted Permission sought to share information with : Family Supports    Share Information with NAME: Santiago Glad     Permission granted to share info w Relationship: spouse     Emotional Assessment Appearance:: Appears stated age Attitude/Demeanor/Rapport: Engaged Affect (typically observed): Accepting Orientation: : Oriented to Situation,Oriented to  Time,Oriented to Place,Oriented to Self Alcohol / Substance Use: Not Applicable Psych Involvement: No (comment)  Admission diagnosis:  Hyperkalemia [E87.5] Lactic acidosis [E87.2] GIB (gastrointestinal bleeding) [K92.2] NSTEMI (non-ST elevated myocardial infarction) ([place]) [I21.4] Chest pain [R07.9] Nonspecific chest pain [R07.9] Gastrointestinal hemorrhage with melena [K92.1] Symptomatic anemia [D64.9] Acute renal failure superimposed on chronic kidney disease ([place]) [N17.9, N18.9] Acute sepsis ([place]) [A41.9] Patient Active Problem List   Diagnosis Date Noted  . Polyp of colon   . Melena   . Hiatal hernia   . Duodenal erythema   . NSTEMI (non-ST elevated myocardial infarction) ([place])   . GIB (gastrointestinal bleeding) 04/15/2021  . Acute kidney injury superimposed on CKD ([place]) 04/15/2021  . Acute blood loss anemia 04/15/2021   PCP:  System, Provider Not In Pharmacy:   [place], [place] [place]. [facility] [address] Phone: [phone] Fax: [phone]     Social Determinants of Health (SDOH)  Interventions    Readmission Risk Interventions No flowsheet data found.

## 2021-04-20 NOTE — Progress Notes (Signed)
Gabriel Antigua, MD [address] [place], [name], [name], [address] [address], [name], [name], [name], [address] Phone: [phone]  Fax: [phone]   Subjective:  No further signs of active bleeding. Denies abdominal pain  Objective: Exam: Vital signs in last 24 hours: Vitals:   04/19/21 2032 04/20/21 0459 04/20/21 0504 04/20/21 0746  BP: (!) 118/59  (!) 145/74 (!) 144/78  Pulse: 72  72 73  Resp: 20  18   Temp: 98.2 F (36.8 C)  (!) 97.5 F (36.4 C) 97.6 F (36.4 C)  TempSrc: Oral  Oral Oral  SpO2: 99%  99% 100%  Weight:  82.4 kg    Height:       Weight change: 1.5 kg  Intake/Output Summary (Last 24 hours) at 04/20/2021 1225 Last data filed at 04/20/2021 0950 Gross per 24 hour  Intake 480 ml  Output 975 ml  Net -495 ml    General: No acute distress, AAO x3 Abd: Soft, NT/ND, No HSM Skin: Warm, no rashes Neck: Supple, Trachea midline   Lab Results: Lab Results  Component Value Date   WBC 10.6 (H) 04/19/2021   HGB 9.3 (L) 04/19/2021   HCT 29.1 (L) 04/19/2021   MCV 98.3 04/19/2021   PLT 209 04/19/2021   Micro Results: Recent Results (from the past 240 hour(s))  Culture, blood (Routine X 2) w Reflex to ID Panel     Status: None   Collection Time: 04/15/21  1:51 AM   Specimen: BLOOD  Result Value Ref Range Status   Specimen Description BLOOD BLOOD RIGHT FOREARM  Final   Special Requests   Final    BOTTLES DRAWN AEROBIC AND ANAEROBIC Blood Culture adequate volume   Culture   Final    NO GROWTH 5 DAYS Performed at [facility], [address] [place], [place] [address]    Report Status 04/20/2021 FINAL  Final  Culture, blood (Routine X 2) w Reflex to ID Panel     Status: None   Collection Time: 04/15/21  1:52 AM   Specimen: BLOOD  Result Value Ref Range Status   Specimen Description BLOOD RIGHT ANTECUBITAL  Final   Special Requests   Final    BOTTLES DRAWN AEROBIC AND ANAEROBIC Blood Culture adequate volume   Culture    Final    NO GROWTH 5 DAYS Performed at [facility], [address] [place], [name] [address]    Report Status 04/20/2021 FINAL  Final  Urine Culture     Status: None   Collection Time: 04/15/21  3:26 AM   Specimen: Urine, Random  Result Value Ref Range Status   Specimen Description   Final    URINE, RANDOM Performed at [facility], [address], [place], [place] [address]    Special Requests   Final    NONE Performed at [facility], [address] [place], [place] [address]    Culture   Final    NO GROWTH Performed at [facility], [place] [address], [place], [place] [address]    Report Status 04/16/2021 FINAL  Final  Resp Panel by RT-PCR (Flu A&B, Covid)     Status: None   Collection Time: 04/15/21  4:59 AM   Specimen: Nasopharyngeal(NP) swabs in vial transport medium  Result Value Ref Range Status   SARS Coronavirus 2 by RT PCR NEGATIVE NEGATIVE Final    Comment: (NOTE) SARS-CoV-2 target nucleic acids are NOT DETECTED.  The SARS-CoV-2 RNA is  generally detectable in upper respiratory specimens during the acute phase of infection. The lowest concentration of SARS-CoV-2 viral copies this assay can detect is 138 copies/mL. A negative result does not preclude SARS-Cov-2 infection and should not be used as the sole basis for treatment or other patient management decisions. A negative result may occur with  improper specimen collection/handling, submission of specimen other than nasopharyngeal swab, presence of viral mutation(s) within the areas targeted by this assay, and inadequate number of viral copies(<138 copies/mL). A negative result must be combined with clinical observations, patient history, and epidemiological information. The expected result is Negative.  Fact Sheet for Patients:  EntrepreneurPulse.com.au  Fact Sheet for Healthcare Providers:  IncredibleEmployment.be  This test is no t  yet approved or cleared by the [place] FDA and  has been authorized for detection and/or diagnosis of SARS-CoV-2 by FDA under an Emergency Use Authorization (EUA). This EUA will remain  in effect (meaning this test can be used) for the duration of the COVID-19 declaration under Section 564(b)(1) of the Act, 21 U.S.C.section 360bbb-3(b)(1), unless the authorization is terminated  or revoked sooner.       Influenza A by PCR NEGATIVE NEGATIVE Final   Influenza B by PCR NEGATIVE NEGATIVE Final    Comment: (NOTE) The Xpert Xpress SARS-CoV-2/FLU/RSV plus assay is intended as an aid in the diagnosis of influenza from Nasopharyngeal swab specimens and should not be used as a sole basis for treatment. Nasal washings and aspirates are unacceptable for Xpert Xpress SARS-CoV-2/FLU/RSV testing.  Fact Sheet for Patients: EntrepreneurPulse.com.au  Fact Sheet for Healthcare Providers: IncredibleEmployment.be  This test is not yet approved or cleared by the [place] FDA and has been authorized for detection and/or diagnosis of SARS-CoV-2 by FDA under an Emergency Use Authorization (EUA). This EUA will remain in effect (meaning this test can be used) for the duration of the COVID-19 declaration under Section 564(b)(1) of the Act, 21 U.S.C. section 360bbb-3(b)(1), unless the authorization is terminated or revoked.  Performed at [facility], [address] [place], [place] [address]   MRSA PCR Screening     Status: Abnormal   Collection Time: 04/15/21  6:13 AM   Specimen: Nasopharyngeal  Result Value Ref Range Status   MRSA by PCR POSITIVE (A) NEGATIVE Final    Comment:        The GeneXpert MRSA Assay (FDA approved for NASAL specimens only), is one component of a comprehensive MRSA colonization surveillance program. It is not intended to diagnose MRSA infection nor to guide or monitor treatment for MRSA infections. RESULT CALLED  TO, READ BACK BY AND VERIFIED WITH: [facility] FURR AT [id number] 04/15/21.PMF Performed at [facility], [place] [place],  [address]   KOH prep     Status: None   Collection Time: 04/18/21 12:46 PM   Specimen: Esophagus  Result Value Ref Range Status   Specimen Description ESOPHAGUS  Final   Special Requests Normal  Final   KOH Prep   Final    YEAST WITH PSEUDOHYPHAE Performed at [facility], [address], [place],  [address]    Report Status 04/18/2021 FINAL  Final   Studies/Results: No results found. Medications:  Scheduled Meds: . amiodarone  400 mg Oral BID  . aspirin EC  81 mg Oral Daily  . atorvastatin  40 mg Oral Daily  . Chlorhexidine Gluconate Cloth  6 each Topical Daily  . feeding supplement  237 mL Oral BID BM  . folic  acid  1 mg Oral Daily  . melatonin  20 mg Oral QHS  . metoprolol tartrate  12.5 mg Oral BID  . mupirocin ointment  1 application Nasal BID  . pantoprazole  40 mg Oral Daily  . polyethylene glycol  17 g Oral Daily  . thiamine  100 mg Oral Daily   Continuous Infusions: PRN Meds:.chlorpheniramine-HYDROcodone, docusate sodium, HYDROcodone-acetaminophen, ipratropium-albuterol   Assessment: Principal Problem:   Acute blood loss anemia Active Problems:   GIB (gastrointestinal bleeding)   Acute kidney injury superimposed on CKD (HCC)   NSTEMI (non-ST elevated myocardial infarction) (HCC)   Melena   Hiatal hernia   Duodenal erythema   Polyp of colon    Plan: Small bowel capsule study sent for scanning  Findings as follows:  "Landmarks: Z-line 00:02:12 First Gastric Image 00:04:03 First Duodenal Image 01:36:07  Findings: No active bleeding seen throughout the exam.  An area of erythema was seen in the small bowel at 4% of the study and likely represents the same erythematous area seen in the duodenal bulb on upper endoscopy this admission.   A non-specific red spot was seen in the small bowel.  A  lymphangiectasia was seen in the distal small bowel  Summary: No evidence of active bleeding throughout the exam  Continue serial CBCs and transfuse PRN  Check iron studies and replace with IV iron if indicated"  If Eliquis is restarted, would recommend close monitoring if possible.   Weigh risks and benefits and discuss with patient and family, if restarting the medication in the setting of NSTEMI and no further active GI bleeding    LOS: 5 days   Gabriel Antigua, MD 04/20/2021, 12:25 PM

## 2021-04-20 NOTE — Care Management Important Message (Signed)
Important Message  Patient Details  Name: Gabriel King MRN: [id number] Date of Birth: [birth year]   Medicare Important Message Given:  Yes     Dannette Barbara 04/20/2021, 2:31 PM

## 2021-04-20 NOTE — Progress Notes (Signed)
Progress Note  Patient Name: Gabriel King Date of Encounter: 04/20/2021  [place] Cardiologist: None   Subjective   Hgb today 9.3. mildly hyperkalemic. BB mildly elevated. PAtinet had urinary retention overnight, had in and out cath. Also had chest discomfort overnight, lasted 5-10 minutes, not the same as what brought him in. No EKG performed. No current chest pain or SOB.   Inpatient Medications    Scheduled Meds: . amiodarone  400 mg Oral BID  . aspirin EC  81 mg Oral Daily  . atorvastatin  40 mg Oral Daily  . Chlorhexidine Gluconate Cloth  6 each Topical Daily  . feeding supplement  237 mL Oral BID BM  . folic acid  1 mg Oral Daily  . melatonin  20 mg Oral QHS  . metoprolol tartrate  12.5 mg Oral BID  . mupirocin ointment  1 application Nasal BID  . pantoprazole  40 mg Oral Daily  . polyethylene glycol  17 g Oral Daily  . sodium zirconium cyclosilicate  10 g Oral Once  . thiamine  100 mg Oral Daily   Continuous Infusions:  PRN Meds: chlorpheniramine-HYDROcodone, docusate sodium, HYDROcodone-acetaminophen, ipratropium-albuterol   Vital Signs    Vitals:   04/19/21 2032 04/20/21 0459 04/20/21 0504 04/20/21 0746  BP: (!) 118/59  (!) 145/74 (!) 144/78  Pulse: 72  72 73  Resp: 20  18   Temp: 98.2 F (36.8 C)  (!) 97.5 F (36.4 C) 97.6 F (36.4 C)  TempSrc: Oral  Oral Oral  SpO2: 99%  99% 100%  Weight:  82.4 kg    Height:        Intake/Output Summary (Last 24 hours) at 04/20/2021 0902 Last data filed at 04/20/2021 0700 Gross per 24 hour  Intake 240 ml  Output 850 ml  Net -610 ml   Last 3 Weights 04/20/2021 04/19/2021 04/18/2021  Weight (lbs) 181 lb 10.5 oz 178 lb 5.6 oz 177 lb 7.5 oz  Weight (kg) 82.4 kg 80.9 kg 80.5 kg      Telemetry     NSR, HR 60-70, occasional PVCs- Personally Reviewed  ECG    No new - Personally Reviewed  Physical Exam   GEN: No acute distress.   Neck: No JVD Cardiac: RRR, no murmurs, rubs, or gallops.  Respiratory:  Rhonchi, minimal wheezing GI: Soft, nontender, non-distended  MS: Trace edema; No deformity. Neuro:  Nonfocal  Psych: Normal affect   Labs    High Sensitivity Troponin:   Recent Labs  Lab 04/15/21 0310 04/15/21 0752 04/15/21 1042 04/16/21 1023 04/16/21 1216  TROPONINIHS 535* 6,671* 10,535* 7,737* 7,692*      Chemistry Recent Labs  Lab 04/15/21 0111 04/15/21 0752 04/18/21 0427 04/19/21 0412 04/20/21 0339  NA 136   < > 135 138 140  K 5.7*   < > 4.8 5.2* 5.2*  CL 107   < > 109 112* 116*  CO2 14*   < > 19* 19* 17*  GLUCOSE 135*   < > 104* 94 92  BUN 163*   < > 114* 100* 84*  CREATININE 3.74*   < > 3.49* 3.34* 3.06*  CALCIUM 8.6*   < > 7.8* 7.9* 7.6*  PROT 4.8*  --  4.3*  --   --   ALBUMIN 2.8*  --  2.2*  --   --   AST 31  --  20  --   --   ALT 15  --  14  --   --  ALKPHOS 85  --  79  --   --   BILITOT 0.7  --  0.8  --   --   GFRNONAA 15*   < > 16* 17* 19*  ANIONGAP 15   < > '7 7 7   '$ < > = values in this interval not displayed.     Hematology Recent Labs  Lab 04/17/21 0313 04/18/21 0427 04/19/21 0412  WBC 13.4* 12.3* 10.6*  RBC 2.72* 2.59* 2.96*  HGB 8.6* 8.2* 9.3*  HCT 26.4* 25.7* 29.1*  MCV 97.1 99.2 98.3  MCH 31.6 31.7 31.4  MCHC 32.6 31.9 32.0  RDW 21.1* 21.2* 21.2*  PLT 208 188 209    BNP Recent Labs  Lab 04/15/21 0111 04/15/21 1442  BNP 442.8* 650.6*     DDimer No results for input(s): DDIMER in the last 168 hours.   Radiology    No results found.  Cardiac Studies   Echo 04/16/21 1. Left ventricular ejection fraction, by estimation, is 45 to 50%. The  left ventricle has mildly decreased function. The left ventricle  demonstrates regional wall motion abnormalities (Hypokinesis of the mid to  distal anterior and anteroseptal and  apical region). There is mild left ventricular hypertrophy. Left  ventricular diastolic parameters are indeterminate.  2. Right ventricular systolic function is normal. The right ventricular  size is  normal. There is normal pulmonary artery systolic pressure. The  estimated right ventricular systolic pressure is 123XX123 mmHg.  3. Left atrial size was mildly dilated.  4. The inferior vena cava is dilated in size with >50% respiratory  variability, suggesting right atrial pressure of 8 mmHg.   Patient Profile     87 y.o. male with h/o CAD s/p PCI to [facility] in 2011 followed by cardiologist in [place], Paroxysmal Afib, CHF, HTN, HLD, CKD stage 3, COPD, previous tobacco use, alcohol use, anemia on iron who is being seen for NSTEMI in the setting of GIB.   Assessment & Plan   Severe Anemia/GIB - presented on 5/14 with SOB and chest tightness found to have Hgb 4 and elevated troponin - FOBT positive - s/p 4 units PRBCs.  - ELiquis held.  - cath held for severe anemia. ASA continued - GI following - EGD and colonoscopy performed. GI recommend capsule endoscopy - Hgb today 9.3  NSTEMI in the setting of severe anemia/GIB H/o of CAD with prior PCI in 2011 - HS trop elevated to 10,000, cath held for anemia as above, also has CKD with creatinine of 3 - home meds continued, lipitor 40 mg daily - Echo showed mildly reduced EF 45-50% - BB held for hypotension>can likely restart - Will likely need OP ischemic work-up with cardiologist in [place].   HFmrEF - echo this admission showed EF 45-50% - home torsemide and BB held for hypotension - trace edema on exam, consider restart home torsemide - Pressures better, restart BB - No Ace/ARB/ARNI with CKD  Paroxysmal Afib - in SR - continue amiodarone '400mg'$  BID - PTA Eliquis 2.'5mg'$  BID>> held for GIB, restart when safe per GI - CHADSVASC at least 5 (CAD, HTN, CHF, AGEx2)  COPD - no acute exacerbation  Alcohol use - 1 drink daily  CKD stage 3 - Creatinine around baseline of 3  For questions or updates, please contact [facility] Please consult [email or web address] for contact info under        Signed, Narvel Kozub Ninfa Meeker, PA-C  04/20/2021,  9:02 AM

## 2021-04-20 NOTE — NC FL2 (Signed)
[place] LEVEL OF CARE SCREENING TOOL     IDENTIFICATION  Patient Name: Gabriel King Birthdate: [birth year] Sex: male Admission Date (Current Location): 04/15/2021  [place] and [place] Number:  Engineering geologist and Address:  [facility], [address] [place], [place] [address]      Provider Number: [id number]  Attending Physician Name and Address:  [name], MD  Relative Name and Phone Number:       Current Level of Care: Hospital Recommended Level of Care: [place] Prior Approval Number:    Date Approved/Denied:   PASRR Number: [id number]  Discharge Plan: SNF    Current Diagnoses: Patient Active Problem List   Diagnosis Date Noted  . Polyp of colon   . Melena   . Hiatal hernia   . Duodenal erythema   . NSTEMI (non-ST elevated myocardial infarction) ([place])   . GIB (gastrointestinal bleeding) 04/15/2021  . Acute kidney injury superimposed on CKD ([place]) 04/15/2021  . Acute blood loss anemia 04/15/2021    Orientation RESPIRATION BLADDER Height & Weight     Self,Time,Situation,Place  Normal Continent Weight: 181 lb 10.5 oz (82.4 kg) Height:  '5\' 11"'$  (180.3 cm)  BEHAVIORAL SYMPTOMS/MOOD NEUROLOGICAL BOWEL NUTRITION STATUS      Incontinent Diet (soft diet, thin liquids)  AMBULATORY STATUS COMMUNICATION OF NEEDS Skin   Limited Assist Verbally  (open wound, right leg, foam dressing, change every 3 days)                       Personal Care Assistance Level of Assistance  Dressing,Feeding,Bathing Bathing Assistance: Limited assistance Feeding assistance: Independent Dressing Assistance: Limited assistance     Functional Limitations Info  Sight,Hearing,Speech Sight Info: Adequate Hearing Info: Adequate Speech Info: Adequate    SPECIAL CARE FACTORS FREQUENCY  PT (By licensed PT),OT (By licensed OT)     PT Frequency: 5x OT Frequency: 5x            Contractures Contractures Info: Not  present    Additional Factors Info  Code Status,Allergies Code Status Info: DNR Allergies Info: Bactrim (Sulfamethoxazole-trimethoprim), Cefpodoxime, Ciprofloxacin, Ertapenem           Current Medications (04/20/2021):  This is the current hospital active medication list Current Facility-Administered Medications  Medication Dose Route Frequency Provider Last Rate Last Admin  . amiodarone (PACERONE) tablet 400 mg  400 mg Oral BID Theora Gianotti, NP   400 mg at 04/20/21 0916  . aspirin EC tablet 81 mg  81 mg Oral Daily Lin Landsman, MD   81 mg at 04/20/21 0916  . atorvastatin (LIPITOR) tablet 40 mg  40 mg Oral Daily Theora Gianotti, NP   40 mg at 04/20/21 0916  . Chlorhexidine Gluconate Cloth 2 % PADS 6 each  6 each Topical Daily Flora Lipps, MD   6 each at 04/20/21 0920  . chlorpheniramine-HYDROcodone (TUSSIONEX) 10-8 MG/5ML suspension 5 mL  5 mL Oral Q12H PRN Lang Snow, NP   5 mL at 04/19/21 2047  . docusate sodium (COLACE) capsule 100 mg  100 mg Oral BID PRN Rust-Chester, Britton L, NP   100 mg at 04/20/21 0916  . feeding supplement (ENSURE ENLIVE / ENSURE PLUS) liquid 237 mL  237 mL Oral BID BM Lin Landsman, MD   237 mL at [id number]  . folic acid (FOLVITE) tablet 1 mg  1 mg Oral Daily Vanga, Tally Due, MD   1 mg at  04/20/21 0916  . HYDROcodone-acetaminophen (NORCO/VICODIN) 5-325 MG per tablet 1 tablet  1 tablet Oral Q6H PRN Domenic Polite, MD   1 tablet at 04/20/21 1050  . ipratropium-albuterol (DUONEB) 0.5-2.5 (3) MG/3ML nebulizer solution 3 mL  3 mL Nebulization Q4H PRN Domenic Polite, MD   3 mL at 04/17/21 1546  . melatonin tablet 20 mg  20 mg Oral QHS Rust-Chester, Britton L, NP   20 mg at 04/19/21 2047  . metoprolol tartrate (LOPRESSOR) tablet 12.5 mg  12.5 mg Oral BID Theora Gianotti, NP   12.5 mg at 04/20/21 0916  . mupirocin ointment (BACTROBAN) 2 % 1 application  1 application Nasal BID Flora Lipps, MD   1  application at [id number]  . pantoprazole (PROTONIX) EC tablet 40 mg  40 mg Oral Daily [name], MD   40 mg at 04/20/21 0916  . polyethylene glycol (MIRALAX / GLYCOLAX) packet 17 g  17 g Oral Daily Lin Landsman, MD   17 g at 04/20/21 0915  . thiamine tablet 100 mg  100 mg Oral Daily Domenic Polite, MD   100 mg at 04/20/21 [id number]     Discharge Medications: Please see discharge summary for a list of discharge medications.  Relevant Imaging Results:  Relevant Lab Results:   Additional Information [id number]  Eileen Stanford, LCSW

## 2021-04-20 NOTE — Evaluation (Signed)
Physical Therapy Evaluation Patient Details Name: Gabriel King MRN: [id number] DOB: July 31, 1932 Today's Date: 04/20/2021   History of Present Illness  Pt is an 87 y.o. male presenting to hospital 5/14 with chest pain x2 days and weakness.  Hgb 4.1 in ED; s/p blood transfusion.  Recent cellultis R LE.  Pt admitted with suspected upper GI bleed, severe acute blood loss anemia, Non-STEMI, AKI on CKD 4, and paroxysmal a-fib.  S/p upper GI endoscopy 5/17; s/p colonoscopy 5/18; s/p capsule endoscopy 5/18.  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  Clinical Impression  Prior to hospital admission, pt was ambulatory; is from [place] but currently visiting [place] and he and his wife are staying with his daughter and grandson.  Currently pt is mod assist semi-supine to sitting edge of bed; CGA to min assist for sitting and standing balance (d/t occasional posterior lean); mod assist to stand from bed up to RW; and CGA to min assist to walk a few feet with RW bed to recliner (increased effort and time for pt to take steps noted).  Pt demonstrating generalized weakness and decreased activity tolerance during sessions activities.  Pt would benefit from skilled PT to address noted impairments and functional limitations (see below for any additional details).  Upon hospital discharge, pt would benefit from SNF.    Follow Up Recommendations SNF    Equipment Recommendations  Rolling walker with 5" wheels;3in1 (PT)    Recommendations for Other Services OT consult     Precautions / Restrictions Precautions Precautions: Fall Precaution Comments: R LE wounds Restrictions Weight Bearing Restrictions: No      Mobility  Bed Mobility Overal bed mobility: Needs Assistance Bed Mobility: Supine to Sit     Supine to sit: Mod  assist;HOB elevated     General bed mobility comments: assist for trunk and scooting towards edge of bed    Transfers Overall transfer level: Needs assistance Equipment used: Rolling walker (2 wheeled) Transfers: Sit to/from Stand Sit to Stand: Mod assist         General transfer comment: assist to initiate and come to full stand up to RW; vc's for UE/LE placement and overall technique; assist to control descent sitting in recliner  Ambulation/Gait Ambulation/Gait assistance: Min guard;Min assist Gait Distance (Feet): 3 Feet (bed to recliner) Assistive device: Rolling walker (2 wheeled)   Gait velocity: decreased   General Gait Details: decreased B LE step length/foot clearance; increased effort and time for pt to take steps  Stairs            Wheelchair Mobility    Modified Rankin (Stroke Patients Only)       Balance Overall balance assessment: Needs assistance Sitting-balance support: Feet supported;Bilateral upper extremity supported Sitting balance-Leahy Scale: Fair Sitting balance - Comments: pt with posterior lean occasionally requiring min assist to CGA for safety Postural control: Posterior lean Standing balance support: Bilateral upper extremity supported Standing balance-Leahy Scale: Fair Standing balance comment: pt with posterior lean occasionally requiring min assist to CGA for safety                             Pertinent Vitals/Pain Pain Assessment: 0-10 Pain Location: pt's "bottom" Pain Descriptors / Indicators: Sore Pain Intervention(s): Limited activity within patient's tolerance;Monitored during session;Repositioned  Vitals (HR and O2 on room  air) stable and WFL throughout treatment session.    Home Living Family/patient expects to be discharged to:: Private residence Living Arrangements: Other (Comment) (currently pt and his wife are staying with his daughter and grandson (pt lives in [place] with his wife)--following  information is pt's daughter's home) Available Help at Discharge: Family Type of Home: House Home Access: Stairs to enter Entrance Stairs-Rails: None Entrance Stairs-Number of Steps: 3 Home Layout: Two level;Able to live on main level with bedroom/bathroom;1/2 bath on main level Home Equipment: None Additional Comments: family plans to put bed on main level for pt    Prior Function Level of Independence: Independent         Comments: 1 fall in March 2022 (tripped coming out of restaurant)     Journalist, newspaper        Extremity/Trunk Assessment   Upper Extremity Assessment Upper Extremity Assessment: Generalized weakness    Lower Extremity Assessment Lower Extremity Assessment: Generalized weakness    Cervical / Trunk Assessment Cervical / Trunk Assessment: Normal  Communication   Communication: HOH (wears hearing aides)  Cognition Arousal/Alertness: Awake/alert Behavior During Therapy: WFL for tasks assessed/performed Overall Cognitive Status: Within Functional Limits for tasks assessed                                        General Comments   Nursing cleared pt for participation in physical therapy.  Pt agreeable to PT session.    Exercises  Transfer training   Assessment/Plan    PT Assessment Patient needs continued PT services  PT Problem List Decreased strength;Decreased activity tolerance;Decreased balance;Decreased mobility;Decreased knowledge of use of DME       PT Treatment Interventions DME instruction;Gait training;Stair training;Functional mobility training;Therapeutic activities;Therapeutic exercise;Balance training;Patient/family education    PT Goals (Current goals can be found in the Care Plan section)  Acute Rehab PT Goals Patient Stated Goal: to improve strength and mobility PT Goal Formulation: With patient Time For Goal Achievement: 05/04/21 Potential to Achieve Goals: Good    Frequency Min 2X/week   Barriers to  discharge Decreased caregiver support      Co-evaluation               AM-PAC PT "6 Clicks" Mobility  Outcome Measure Help needed turning from your back to your side while in a flat bed without using bedrails?: A Little Help needed moving from lying on your back to sitting on the side of a flat bed without using bedrails?: A Lot Help needed moving to and from a bed to a chair (including a wheelchair)?: A Little Help needed standing up from a chair using your arms (e.g., wheelchair or bedside chair)?: A Lot Help needed to walk in hospital room?: A Lot Help needed climbing 3-5 steps with a railing? : A Lot 6 Click Score: 14    End of Session Equipment Utilized During Treatment: Gait belt Activity Tolerance: Patient limited by fatigue Patient left: in chair;with call bell/phone within reach;with chair alarm set Nurse Communication: Mobility status;Precautions PT Visit Diagnosis: Other abnormalities of gait and mobility (R26.89);Muscle weakness (generalized) (M62.81);History of falling (Z91.81)    Time: [id number] PT Time Calculation (min) (ACUTE ONLY): 32 min   Charges:   PT Evaluation $PT Eval Low Complexity: 1 Low PT Treatments $Therapeutic Activity: 8-22 mins       Leitha Bleak, PT 04/20/21, 1:51 PM

## 2021-04-20 NOTE — Evaluation (Signed)
Occupational Therapy Evaluation Patient Details Name: Gabriel King MRN: [id number] DOB: [birth year] Today's Date: 04/20/2021    History of Present Illness Pt is an 87 y.o. male presenting to hospital 5/14 with chest pain x2 days and weakness.  Hgb 4.1 in ED; s/p blood transfusion.  Recent cellultis R LE.  Pt admitted with suspected upper GI bleed, severe acute blood loss anemia, Non-STEMI, AKI on CKD 4, and paroxysmal a-fib.  S/p upper GI endoscopy 5/17; s/p colonoscopy 5/18; s/p capsule endoscopy 5/18.  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   Clinical Impression   Mr Heidinger was seen for OT evaluation this date. Prior to hospital admission, pt was Independent for mobility and ADLs. Pt and his wife currently visiting from [place], staying with daughter and grandson in 2 level home with bed/bath upstairs. Pt presents to acute OT demonstrating impaired ADL performance and functional mobility 2/2 decreased activity tolerance, functional strength/ROM/balance deficits, and poor safety awareness. Pt reports mild pain in B heels, RN notified and left with BLE floating on 2 pillows at end of session.   Pt currently requires MIN A exit L side of bed. Pt required significantly increased time to perform mobility and grooming tasks with decreased assistance. MIN A  + RW for ADL t/f, pt repeatedly attempting to sit before in front of chair and requires MIN A to control descent. SETUP + CGA for standing grooming tasks. Pt required multiple prolonged seated rest breaks and tolerated ~2 mins standing for 4 trials to complete tooth brushing/denture cleaning. CGA sit>sup, utilized BUE to assist with returning BLE to bed. Pt would benefit from skilled OT to address noted impairments and functional limitations (see below for any  additional details) in order to maximize safety and independence while minimizing falls risk and caregiver burden. Upon hospital discharge, recommend STR to maximize pt safety and return to PLOF.     Follow Up Recommendations  SNF    Equipment Recommendations  3 in 1 bedside commode    Recommendations for Other Services       Precautions / Restrictions Precautions Precautions: Fall Precaution Comments: R LE wounds Restrictions Weight Bearing Restrictions: No      Mobility Bed Mobility Overal bed mobility: Needs Assistance Bed Mobility: Supine to Sit;Sit to Supine     Supine to sit: Min assist Sit to supine: Min guard;HOB elevated   General bed mobility comments: significantly increased time to perform    Transfers Overall transfer level: Needs assistance Equipment used: Rolling walker (2 wheeled) Transfers: Sit to/from [facility] Sit to Stand: Min assist;From elevated surface Stand pivot transfers: Min guard       General transfer comment: assist to control descent and initiate lift off    Balance Overall balance assessment: Needs assistance Sitting-balance support: Feet supported;Bilateral upper extremity supported Sitting balance-Leahy Scale: Fair Sitting balance - Comments: fair dynamic balance reaching outside BOS Standing balance support: Single extremity supported;During functional activity Standing balance-Leahy Scale: Fair Standing balance comment: Single UE support on counter at all times during grooming                           ADL either performed or assessed with clinical judgement   ADL Overall ADL's : Needs assistance/impaired  General ADL Comments: MIN A  + RW for ADL t/f. SETUP + CGA for standing grooming tasks. Pt required multiple seated rest breaksa nd tolerated ~2 mins standing for 4 trials to complete tooth brushing/denture cleaning.                   Pertinent Vitals/Pain Pain Assessment: Faces Faces Pain Scale: Hurts little more Pain Location: B heels Pain Descriptors / Indicators: Sore Pain Intervention(s): Limited activity within patient's tolerance;Repositioned     Hand Dominance Right   Extremity/Trunk Assessment Upper Extremity Assessment Upper Extremity Assessment: Generalized weakness   Lower Extremity Assessment Lower Extremity Assessment: Generalized weakness   Cervical / Trunk Assessment Cervical / Trunk Assessment: Normal   Communication Communication Communication: HOH   Cognition Arousal/Alertness: Awake/alert Behavior During Therapy: WFL for tasks assessed/performed Overall Cognitive Status: Within Functional Limits for tasks assessed                                     General Comments       Exercises Exercises: Other exercises Other Exercises Other Exercises: Pt and family educated re: OT role, DME recs, d/c recs, ECS, positioning for wound prevention, safe t/f and RW techinques, importance of mobility for functional strengthening Other Exercises: Grooming, sup<>sit, sit<>stand x4, SPT, sitting/standing balance/tolerance   Shoulder Instructions      Home Living Family/patient expects to be discharged to:: Private residence Living Arrangements: Other (Comment) (currently pt and his wife are staying with his daughter and grandson (pt lives in [place] with his wife)--following information is pt's daughter's home) Available Help at Discharge: Family Type of Home: House Home Access: Stairs to enter Technical brewer of Steps: 3 Entrance Stairs-Rails: None Home Layout: Two level;1/2 bath on main level;Bed/bath upstairs Alternate Level Stairs-Number of Steps: flight   Bathroom Shower/Tub:  (shower upstairs)   Bathroom Toilet: Standard     Home Equipment: None   Additional Comments: family plans to put bed in living room for pt      Prior Functioning/Environment Level of  Independence: Independent        Comments: 1 fall in March 2022 (tripped coming out of restaurant)        OT Problem List: Decreased strength;Decreased activity tolerance;Decreased range of motion;Impaired balance (sitting and/or standing);Decreased safety awareness;Decreased knowledge of use of DME or AE      OT Treatment/Interventions: Self-care/ADL training;Therapeutic exercise;Energy conservation;DME and/or AE instruction;Therapeutic activities;Patient/family education;Balance training    OT Goals(Current goals can be found in the care plan section) Acute Rehab OT Goals Patient Stated Goal: to improve strength and mobility OT Goal Formulation: With patient/family Time For Goal Achievement: 05/04/21 Potential to Achieve Goals: Good ADL Goals Pt Will Perform Grooming: with modified independence;standing (c LRAD PRN) Pt Will Perform Lower Body Dressing: with modified independence;sit to/from stand (c LRAD PRN) Pt Will Transfer to Toilet: with modified independence;ambulating;regular height toilet (c LRAD PRN)  OT Frequency: Min 2X/week   Barriers to D/C: Inaccessible home environment;Decreased caregiver support             AM-PAC OT "6 Clicks" Daily Activity     Outcome Measure Help from another person eating meals?: None Help from another person taking care of personal grooming?: A Little Help from another person toileting, which includes using toliet, bedpan, or urinal?: A Little Help from another person bathing (including washing, rinsing, drying)?: A Lot Help from another person to put on  and taking off regular upper body clothing?: A Little Help from another person to put on and taking off regular lower body clothing?: A Lot 6 Click Score: 17   End of Session Equipment Utilized During Treatment: Rolling walker Nurse Communication: Mobility status  Activity Tolerance: Patient tolerated treatment well Patient left: in bed;with call bell/phone within reach;with bed  alarm set;with family/visitor present  OT Visit Diagnosis: Other abnormalities of gait and mobility (R26.89);Muscle weakness (generalized) (M62.81)                Time: 1433-1530 OT Time Calculation (min): 57 min Charges:  OT General Charges $OT Visit: 1 Visit OT Evaluation $OT Eval Low Complexity: 1 Low OT Treatments $Self Care/Home Management : 38-52 mins   Dessie Coma, M.S. OTR/L  04/20/21, 4:00 PM  ascom [phone]

## 2021-04-21 DIAGNOSIS — I214 Non-ST elevation (NSTEMI) myocardial infarction: Secondary | ICD-10-CM

## 2021-04-21 DIAGNOSIS — N179 Acute kidney failure, unspecified: Secondary | ICD-10-CM | POA: Diagnosis not present

## 2021-04-21 DIAGNOSIS — D62 Acute posthemorrhagic anemia: Secondary | ICD-10-CM | POA: Diagnosis not present

## 2021-04-21 DIAGNOSIS — I480 Paroxysmal atrial fibrillation: Secondary | ICD-10-CM | POA: Diagnosis not present

## 2021-04-21 DIAGNOSIS — N189 Chronic kidney disease, unspecified: Secondary | ICD-10-CM

## 2021-04-21 DIAGNOSIS — R079 Chest pain, unspecified: Secondary | ICD-10-CM | POA: Diagnosis not present

## 2021-04-21 DIAGNOSIS — K921 Melena: Secondary | ICD-10-CM | POA: Diagnosis not present

## 2021-04-21 LAB — HEMOGLOBIN: Hemoglobin: 8.2 g/dL — ABNORMAL LOW (ref 13.0–17.0)

## 2021-04-21 LAB — RESP PANEL BY RT-PCR (FLU A&B, COVID) ARPGX2
Influenza A by PCR: NEGATIVE
Influenza B by PCR: NEGATIVE
SARS Coronavirus 2 by RT PCR: NEGATIVE

## 2021-04-21 LAB — POTASSIUM: Potassium: 4.9 mmol/L (ref 3.5–5.1)

## 2021-04-21 LAB — MAGNESIUM: Magnesium: 2.1 mg/dL (ref 1.7–2.4)

## 2021-04-21 MED ORDER — TAMSULOSIN HCL 0.4 MG PO CAPS
0.4000 mg | ORAL_CAPSULE | Freq: Every day | ORAL | Status: DC
  Administered 2021-04-21 – 2021-04-24 (×4): 0.4 mg via ORAL
  Filled 2021-04-21 (×4): qty 1

## 2021-04-21 MED ORDER — FERROUS SULFATE 325 (65 FE) MG PO TABS
325.0000 mg | ORAL_TABLET | Freq: Two times a day (BID) | ORAL | Status: DC
  Administered 2021-04-21 – 2021-04-24 (×6): 325 mg via ORAL
  Filled 2021-04-21 (×6): qty 1

## 2021-04-21 MED ORDER — AMIODARONE HCL 200 MG PO TABS: 200.0000 mg | ORAL_TABLET | Freq: Two times a day (BID) | ORAL | Status: DC

## 2021-04-21 MED ORDER — GUAIFENESIN ER 600 MG PO TB12
600.0000 mg | ORAL_TABLET | Freq: Two times a day (BID) | ORAL | Status: DC
  Administered 2021-04-21 – 2021-04-24 (×7): 600 mg via ORAL
  Filled 2021-04-21 (×7): qty 1

## 2021-04-21 MED ORDER — ACETAMINOPHEN 500 MG PO TABS
500.0000 mg | ORAL_TABLET | Freq: Four times a day (QID) | ORAL | Status: DC | PRN
  Administered 2021-04-21 – 2021-04-23 (×2): 500 mg via ORAL
  Filled 2021-04-21 (×3): qty 1

## 2021-04-21 MED ORDER — MENTHOL 3 MG MT LOZG
1.0000 | LOZENGE | OROMUCOSAL | Status: DC | PRN
  Filled 2021-04-21: qty 9

## 2021-04-21 NOTE — Progress Notes (Signed)
[facility] [place] NAME: Gabriel King    MR#:  [id number]  DATE OF BIRTH:  March 28, 1932  SUBJECTIVE:  patient continues to experience intermittent difficulty urination. Using urine all in the bed at bedside. Denies any other complaints. Overall feeling better. Appreciates the care given here.  REVIEW OF SYSTEMS:   Review of Systems  Constitutional: Negative for chills, fever and weight loss.  HENT: Negative for ear discharge, ear pain and nosebleeds.   Eyes: Negative for blurred vision, pain and discharge.  Respiratory: Negative for sputum production, shortness of breath, wheezing and stridor.   Cardiovascular: Negative for chest pain, palpitations, orthopnea and PND.  Gastrointestinal: Positive for heartburn. Negative for abdominal pain, diarrhea, nausea and vomiting.  Genitourinary: Negative for frequency and urgency.  Musculoskeletal: Negative for back pain and joint pain.  Neurological: Positive for weakness. Negative for sensory change, speech change and focal weakness.  Psychiatric/Behavioral: Negative for depression and hallucinations. The patient is not nervous/anxious.    Tolerating Diet:yes Tolerating PT: rehab  DRUG ALLERGIES:   Allergies  Allergen Reactions  . Bactrim [Sulfamethoxazole-Trimethoprim]   . Cefpodoxime Swelling  . Ciprofloxacin   . Ertapenem Anxiety and Other (See Comments)    VITALS:  Blood pressure (!) 99/53, pulse (!) 55, temperature 97.6 F (36.4 C), temperature source Axillary, resp. rate 19, height '5\' 11"'$  (1.803 m), weight 82.4 kg, SpO2 100 %.  PHYSICAL EXAMINATION:   Physical Exam  GENERAL:  87 y.o.-year-old patient lying in the bed with no acute distress.  LUNGS: Normal breath sounds bilaterally, no wheezing, rales, rhonchi. No use of accessory muscles of respiration.  CARDIOVASCULAR: S1, S2 normal. No murmurs, rubs, or gallops.  ABDOMEN: Soft, nontender, nondistended. Bowel sounds present. No  organomegaly or mass.  EXTREMITIES: No cyanosis, clubbing or edema b/l.    NEUROLOGIC: Cranial nerves II through XII are intact. No focal Motor or sensory deficits b/l.   PSYCHIATRIC:  patient is alert and oriented x 3.  SKIN: No obvious rash, lesion, or ulcer.   LABORATORY PANEL:  CBC Recent Labs  Lab 04/19/21 0412 04/21/21 0339  WBC 10.6*  --   HGB 9.3* 8.2*  HCT 29.1*  --   PLT 209  --     Chemistries  Recent Labs  Lab 04/18/21 0427 04/19/21 0412 04/20/21 0339 04/21/21 0339  NA 135   < > 140  --   K 4.8   < > 5.2* 4.9  CL 109   < > 116*  --   CO2 19*   < > 17*  --   GLUCOSE 104*   < > 92  --   BUN 114*   < > 84*  --   CREATININE 3.49*   < > 3.06*  --   CALCIUM 7.8*   < > 7.6*  --   MG 1.8  --   --  2.1  AST 20  --   --   --   ALT 14  --   --   --   ALKPHOS 79  --   --   --   BILITOT 0.8  --   --   --    < > = values in this interval not displayed.   Cardiac Enzymes No results for input(s): TROPONINI in the last 168 hours. RADIOLOGY:  No results found. ASSESSMENT AND PLAN:   87 year old male with history of CAD, multiple PCI and stents, chronic CHF, COPD, former smoker, CKD 4,  paroxysmal atrial fibrillation on Eliquis, resident of [place], currently visiting [place] for his grandsons graduation/church celebration, presented to the emergency room 5/14 with chest pain, worsening dyspnea on exertion for few weeks, melena. In the ER he was noted to have a hemoglobin of 4, creatinine of 3.7, high-sensitivity Troponin of 10,000.  G.I. bleed-- etiology unclear severe acute blood loss anemia -- patient presented with increasing shortness of breath weakness hemoglobin of 4.0--- four unit blood transfusion-- 9.3 -- EGD White nummular lesions in esophageal mucosa.                         Brushings performed.                        - Non-obstructing Schatzki ring.                        - Medium-sized hiatal hernia.                        - Erythematous  duodenopathy. -- Colonoscopy - Preparation of the colon was poor.                        - Many 4 to 7 mm, non-bleeding polyps in the sigmoid                         colon and in the transverse colon.                        - Diverticulosis in the entire examined colon.                        - Blood in the entire examined colon.                        - The examination was otherwise normal.                        - Non-bleeding internal hemorrhoids. -- G.I. Dr. Bonna Gains recommends capsule endoscopy which is being performed. --capsule endoscopy--inconclusive --ok to reume low dose Eliquis per GI since no obvious source of bleeding identified  NSTEMI In the setting of G.I. bleed -- high-sensitivity troponin peaked 10,000-- patient has history of CAD with PCI to diagonal in 2011. Follows with cardiology in [place]. -- Per [facility] cardiology continue aspirin, Lipitor 40 mg daily.-- Cardiology does not recommend left heart cath due to active bleeding. -- stable blood pressure  Paroxysmal atrial fibrillation -- maintaining sinus rhythm --on  amiodarone 400 mg BID -- holding eliquis due to G.I. bleed --5/20-- eliquis now resumed.   Addendum-- discussed with Dr. Bonna Gains. Since no obvious source of G.I. bleed found will start patient on eliquis while he is in-house to ensure hemoglobin remains stable.  COPD -- stable  H/o Alcohol use -- no signs of withdrawal  Generalized weakness with deconditioning -- PT recommends rehab. TOC for discharge planning. Awaiting bed offers  Procedures: EGD, colonoscopy, capsule endoscopy Family communication : wife at bedside Consults : cardiology, G.I. CODE STATUS: DNR DVT Prophylaxis : eliquis Level of care: Med-Surg Status is: Inpatient  Remains inpatient appropriate because:Inpatient level of care appropriate due to severity of illness   Dispo: The patient is  from: Home              Anticipated d/c is to: Home              Patient currently  medically optimized   Difficult to place patient No  Will discharged to rehab once bed available. Patient and wife agreeable.     TOTAL TIME TAKING CARE OF THIS PATIENT: 25 minutes.  >50% time spent on counselling and coordination of care  Note: This dictation was prepared with Dragon dictation along with smaller phrase technology. Any transcriptional errors that result from this process are unintentional.  [name] M.D    [facility]   CC: Primary care physician; System, Provider Not InPatient ID: Gabriel King, male   DOB: 1932-08-01, 87 y.o.   MRN: [id number]

## 2021-04-21 NOTE — Progress Notes (Signed)
Called to room by NT with patient complaints of dizziness. Upon checking vitals, BP 70s/40s. Elevated patient's legs in recliner and contacted MD Posey Pronto. Verbal order from MD Posey Pronto to give 219m bolus NS and recheck BP. BP rechecked at completion 90s/50s. MD patel made aware via secure chat. MD recommends to monitor patient at this time and hold all BP Meds for the rest of the day. This RN already placed a Hold on all BP meds on night shift schedule. Wife at bedside with patient. Patient remains alert and oriented at this time. Resting in recliner with no needs at this time. Will continue to monitor.

## 2021-04-21 NOTE — Progress Notes (Signed)
Patient continues to have painful experience trying to start urine stream. MD Posey Pronto notified and new order placed for Flomax. Flomax administered to patient with education completed at that time.

## 2021-04-21 NOTE — Progress Notes (Signed)
Progress Note  Patient Name: Gabriel King Date of Encounter: 04/21/2021  [facility] Cardiologist: Followed by cardiology in [place], Visiting the area, followed by [facility] [place]  Subjective   No complaints today apart from difficulty with urination, wife also concerned about dark brown in color of his legs below the knees which is chronic, worried about his general weakness, word about leaving the hospital  She is scheduled to fly to [facility] to see more for family's graduations  Inpatient Medications    Scheduled Meds: . [START ON 04/25/2021] amiodarone  200 mg Oral BID  . amiodarone  400 mg Oral BID  . aspirin EC  81 mg Oral Daily  . atorvastatin  40 mg Oral Daily  . collagenase   Topical Daily  . feeding supplement  237 mL Oral BID BM  . ferrous sulfate  325 mg Oral BID WC  . folic acid  1 mg Oral Daily  . guaiFENesin  600 mg Oral BID  . melatonin  20 mg Oral QHS  . metoprolol tartrate  12.5 mg Oral BID  . pantoprazole  40 mg Oral Daily  . polyethylene glycol  17 g Oral Daily  . tamsulosin  0.4 mg Oral Daily  . thiamine  100 mg Oral Daily   Continuous Infusions:  PRN Meds: chlorpheniramine-HYDROcodone, docusate sodium, HYDROcodone-acetaminophen, ipratropium-albuterol, menthol-cetylpyridinium   Vital Signs    Vitals:   04/21/21 0952 04/21/21 1145 04/21/21 1348 04/21/21 1433  BP: (!) 109/55 (!) 99/53 (!) 91/43 (!) 92/49  Pulse: 63 (!) 55 (!) 51 (!) 57  Resp: 19   18  Temp: 97.9 F (36.6 C) 97.6 F (36.4 C)    TempSrc: Oral Axillary    SpO2: 97% 100% 100% 100%  Weight:      Height:        Intake/Output Summary (Last 24 hours) at 04/21/2021 1441 Last data filed at 04/21/2021 0955 Gross per 24 hour  Intake 480 ml  Output 675 ml  Net -195 ml   Last 3 Weights 04/20/2021 04/19/2021 04/18/2021  Weight (lbs) 181 lb 10.5 oz 178 lb 5.6 oz 177 lb 7.5 oz  Weight (kg) 82.4 kg 80.9 kg 80.5 kg      Telemetry    Normal sinus rhythm- Personally  Reviewed  ECG     - Personally Reviewed  Physical Exam   GEN: No acute distress.   Neck: No JVD Cardiac: RRR, no murmurs, rubs, or gallops.  Respiratory: Clear to auscultation bilaterally. GI: Soft, nontender, non-distended  MS: No edema; No deformity. Neuro:  Nonfocal  Psych: Normal affect   Labs    High Sensitivity Troponin:   Recent Labs  Lab 04/15/21 0310 04/15/21 0752 04/15/21 1042 04/16/21 1023 04/16/21 1216  TROPONINIHS 535* 6,671* 10,535* 7,737* 7,692*      Chemistry Recent Labs  Lab 04/15/21 0111 04/15/21 [id number] 04/18/21 0427 04/19/21 0412 04/20/21 0339 04/21/21 0339  NA 136   < > 135 138 140  --   K 5.7*   < > 4.8 5.2* 5.2* 4.9  CL 107   < > 109 112* 116*  --   CO2 14*   < > 19* 19* 17*  --   GLUCOSE 135*   < > 104* 94 92  --   BUN 163*   < > 114* 100* 84*  --   CREATININE 3.74*   < > 3.49* 3.34* 3.06*  --   CALCIUM 8.6*   < > 7.8* 7.9* 7.6*  --  PROT 4.8*  --  4.3*  --   --   --   ALBUMIN 2.8*  --  2.2*  --   --   --   AST 31  --  20  --   --   --   ALT 15  --  14  --   --   --   ALKPHOS 85  --  79  --   --   --   BILITOT 0.7  --  0.8  --   --   --   GFRNONAA 15*   < > 16* 17* 19*  --   ANIONGAP 15   < > '7 7 7  '$ --    < > = values in this interval not displayed.     Hematology Recent Labs  Lab 04/17/21 0313 04/18/21 0427 04/19/21 0412 04/21/21 0339  WBC 13.4* 12.3* 10.6*  --   RBC 2.72* 2.59* 2.96*  --   HGB 8.6* 8.2* 9.3* 8.2*  HCT 26.4* 25.7* 29.1*  --   MCV 97.1 99.2 98.3  --   MCH 31.6 31.7 31.4  --   MCHC 32.6 31.9 32.0  --   RDW 21.1* 21.2* 21.2*  --   PLT 208 188 209  --     BNP Recent Labs  Lab 04/15/21 0111 04/15/21 1442  BNP 442.8* 650.6*     DDimer No results for input(s): DDIMER in the last 168 hours.   Radiology    No results found.  Cardiac Studies     Patient Profile     87 y.o. male with h/o CAD s/p PCI to diag in 2011 followed by cardiologist in [place], Paroxysmal Afib, CHF, HTN, HLD, CKD stage  3, COPD, previous tobacco use, alcohol use, anemia on iron who is being seen for NSTEMI in the setting of GIB.   Assessment & Plan    GI bleed/anemia  Hemoglobin of 4 on arrival   Received 4 units, Eliquis held  GI completed work-up EGD colonoscopy capsule enteroscopy with no source - Attempt to reinitiate Eliquis yesterday, slight drop in hemoglobin today -Would recommend holding Eliquis likely for 2 weeks let GI pathology he will then reintroduce Eliquis at that time   Non-STEMI  In the setting of severe GI bleed/anemia hemoglobin 4  Not a good candidate for catheterization given creatinine of 3, unclear etiology of his anemia  Denies anginal symptoms Echocardiogram with ejection fraction 45 to 50% but baseline unclear  -Further outpatient ischemic work-up when he gets back to [place] unless he has unstable angina symptoms before then   Paroxysmal atrial fibrillation  Noted on arrival to the hospital, converting to normal sinus rhythm, has been continued on amiodarone now on 400 twice daily needs 7 days then 200 twice daily  Attempt to restart Eliquis 2.5 twice daily with slight drop in hemoglobin today CHADSVASC at least 5 (CAD, HTN, CHF, AGEx2)  -Despite the risk, would hold the Eliquis continue amiodarone to maintain normal sinus rhythm through this difficult.   Chronic kidney disease stage III  Appears to be at his baseline creatinine 3  Needs close follow-up with nephrology in [place]   Weakness, risk of falls  -Numerous hospital admissions over the past year for various issues, leading to weakened state -Would recommend he not travel around the country due to various family graduations, will stay near his physicians in [place] discussion with patient concerning numerous issues including urinary retention, brawny color of his legs, weakness,  concern of blood count, reinitiation of Eliquis, leaving the hospital concerns    Total encounter time more than 35 minutes   Greater than 50% was spent in counseling and coordination of care with the patient   For questions or updates, please contact [place] Please consult [email or web address] for contact info under        Signed, Ida Rogue, MD  04/21/2021, 2:41 PM

## 2021-04-21 NOTE — Progress Notes (Signed)
Vonda Antigua, MD [address], [place], [place], [name], [address] [address], [place], [place], [name], [address] Phone: [phone]  Fax: [phone]   Subjective: Patient denies any active bleeding.  No abdominal pain.   Objective: Exam: Vital signs in last 24 hours: Vitals:   04/21/21 1145 04/21/21 1348 04/21/21 1433 04/21/21 1542  BP: (!) 99/53 (!) 91/43 (!) 92/49 (!) 114/57  Pulse: (!) 55 (!) 51 (!) 57 60  Resp:   18 19  Temp: 97.6 F (36.4 C)   (!) 97.5 F (36.4 C)  TempSrc: Axillary   Axillary  SpO2: 100% 100% 100% 100%  Weight:      Height:       Weight change:   Intake/Output Summary (Last 24 hours) at 04/21/2021 1623 Last data filed at 04/21/2021 [id number] Gross per 24 hour  Intake 480 ml  Output 675 ml  Net -195 ml    General: No acute distress, AAO x3 Abd: Soft, NT/ND, No HSM Skin: Warm, no rashes Neck: Supple, Trachea midline   Lab Results: Lab Results  Component Value Date   WBC 10.6 (H) 04/19/2021   HGB 8.2 (L) 04/21/2021   HCT 29.1 (L) 04/19/2021   MCV 98.3 04/19/2021   PLT 209 04/19/2021   Micro Results: Recent Results (from the past 240 hour(s))  Culture, blood (Routine X 2) w Reflex to ID Panel     Status: None   Collection Time: 04/15/21  1:51 AM   Specimen: BLOOD  Result Value Ref Range Status   Specimen Description BLOOD BLOOD RIGHT FOREARM  Final   Special Requests   Final    BOTTLES DRAWN AEROBIC AND ANAEROBIC Blood Culture adequate volume   Culture   Final    NO GROWTH 5 DAYS Performed at [facility], [address] [place], [place] [address]    Report Status 04/20/2021 FINAL  Final  Culture, blood (Routine X 2) w Reflex to ID Panel     Status: None   Collection Time: 04/15/21  1:52 AM   Specimen: BLOOD  Result Value Ref Range Status   Specimen Description BLOOD RIGHT ANTECUBITAL  Final   Special Requests   Final    BOTTLES DRAWN AEROBIC AND ANAEROBIC Blood Culture adequate volume   Culture    Final    NO GROWTH 5 DAYS Performed at [facility], [address] [place], [place] [address]    Report Status 04/20/2021 FINAL  Final  Urine Culture     Status: None   Collection Time: 04/15/21  3:26 AM   Specimen: Urine, Random  Result Value Ref Range Status   Specimen Description   Final    URINE, RANDOM Performed at [facility], [address] [place], [place] [address]    Special Requests   Final    NONE Performed at [facility], [address], [place], [place] [address]    Culture   Final    NO GROWTH Performed at [facility], [place] [address] [place], [place] [address]    Report Status 04/16/2021 FINAL  Final  Resp Panel by RT-PCR (Flu A&B, Covid)     Status: None   Collection Time: 04/15/21  4:59 AM   Specimen: Nasopharyngeal(NP) swabs in vial transport medium  Result Value Ref Range Status   SARS Coronavirus 2 by RT PCR NEGATIVE NEGATIVE Final    Comment: (NOTE) SARS-CoV-2 target nucleic acids are NOT DETECTED.  The SARS-CoV-2 RNA is generally  detectable in upper respiratory specimens during the acute phase of infection. The lowest concentration of SARS-CoV-2 viral copies this assay can detect is 138 copies/mL. A negative result does not preclude SARS-Cov-2 infection and should not be used as the sole basis for treatment or other patient management decisions. A negative result may occur with  improper specimen collection/handling, submission of specimen other than nasopharyngeal swab, presence of viral mutation(s) within the areas targeted by this assay, and inadequate number of viral copies(<138 copies/mL). A negative result must be combined with clinical observations, patient history, and epidemiological information. The expected result is Negative.  Fact Sheet for Patients:  EntrepreneurPulse.com.au  Fact Sheet for Healthcare Providers:  IncredibleEmployment.be  This test is no t  yet approved or cleared by the [place] FDA and  has been authorized for detection and/or diagnosis of SARS-CoV-2 by FDA under an Emergency Use Authorization (EUA). This EUA will remain  in effect (meaning this test can be used) for the duration of the COVID-19 declaration under Section 564(b)(1) of the Act, 21 U.S.C.section 360bbb-3(b)(1), unless the authorization is terminated  or revoked sooner.       Influenza A by PCR NEGATIVE NEGATIVE Final   Influenza B by PCR NEGATIVE NEGATIVE Final    Comment: (NOTE) The Xpert Xpress SARS-CoV-2/FLU/RSV plus assay is intended as an aid in the diagnosis of influenza from Nasopharyngeal swab specimens and should not be used as a sole basis for treatment. Nasal washings and aspirates are unacceptable for Xpert Xpress SARS-CoV-2/FLU/RSV testing.  Fact Sheet for Patients: EntrepreneurPulse.com.au  Fact Sheet for Healthcare Providers: IncredibleEmployment.be  This test is not yet approved or cleared by the [place] FDA and has been authorized for detection and/or diagnosis of SARS-CoV-2 by FDA under an Emergency Use Authorization (EUA). This EUA will remain in effect (meaning this test can be used) for the duration of the COVID-19 declaration under Section 564(b)(1) of the Act, 21 U.S.C. section 360bbb-3(b)(1), unless the authorization is terminated or revoked.  Performed at [facility], [address] [place], [place] [address]   MRSA PCR Screening     Status: Abnormal   Collection Time: 04/15/21  6:13 AM   Specimen: Nasopharyngeal  Result Value Ref Range Status   MRSA by PCR POSITIVE (A) NEGATIVE Final    Comment:        The GeneXpert MRSA Assay (FDA approved for NASAL specimens only), is one component of a comprehensive MRSA colonization surveillance program. It is not intended to diagnose MRSA infection nor to guide or monitor treatment for MRSA infections. RESULT CALLED  TO, READ BACK BY AND VERIFIED WITH: [facility] FURR AT [id number] 04/15/21.PMF Performed at [facility] [address] [place], [place] [address]   KOH prep     Status: None   Collection Time: 04/18/21 12:46 PM   Specimen: Esophagus  Result Value Ref Range Status   Specimen Description ESOPHAGUS  Final   Special Requests Normal  Final   KOH Prep   Final    YEAST WITH PSEUDOHYPHAE Performed at [facility], [address] [place], [place] [address]    Report Status 04/18/2021 FINAL  Final  Resp Panel by RT-PCR (Flu A&B, Covid) Nasopharyngeal Swab     Status: None   Collection Time: 04/21/21 11:59 AM   Specimen: Nasopharyngeal Swab; Nasopharyngeal(NP) swabs in vial transport medium  Result Value Ref Range Status   SARS Coronavirus 2 by RT PCR NEGATIVE NEGATIVE Final    Comment: (NOTE) SARS-CoV-2 target  nucleic acids are NOT DETECTED.  The SARS-CoV-2 RNA is generally detectable in upper respiratory specimens during the acute phase of infection. The lowest concentration of SARS-CoV-2 viral copies this assay can detect is 138 copies/mL. A negative result does not preclude SARS-Cov-2 infection and should not be used as the sole basis for treatment or other patient management decisions. A negative result may occur with  improper specimen collection/handling, submission of specimen other than nasopharyngeal swab, presence of viral mutation(s) within the areas targeted by this assay, and inadequate number of viral copies(<138 copies/mL). A negative result must be combined with clinical observations, patient history, and epidemiological information. The expected result is Negative.  Fact Sheet for Patients:  EntrepreneurPulse.com.au  Fact Sheet for Healthcare Providers:  IncredibleEmployment.be  This test is no t yet approved or cleared by the [place] FDA and  has been authorized for detection and/or diagnosis of SARS-CoV-2 by FDA  under an Emergency Use Authorization (EUA). This EUA will remain  in effect (meaning this test can be used) for the duration of the COVID-19 declaration under Section 564(b)(1) of the Act, 21 U.S.C.section 360bbb-3(b)(1), unless the authorization is terminated  or revoked sooner.       Influenza A by PCR NEGATIVE NEGATIVE Final   Influenza B by PCR NEGATIVE NEGATIVE Final    Comment: (NOTE) The Xpert Xpress SARS-CoV-2/FLU/RSV plus assay is intended as an aid in the diagnosis of influenza from Nasopharyngeal swab specimens and should not be used as a sole basis for treatment. Nasal washings and aspirates are unacceptable for Xpert Xpress SARS-CoV-2/FLU/RSV testing.  Fact Sheet for Patients: EntrepreneurPulse.com.au  Fact Sheet for Healthcare Providers: IncredibleEmployment.be  This test is not yet approved or cleared by the [place] FDA and has been authorized for detection and/or diagnosis of SARS-CoV-2 by FDA under an Emergency Use Authorization (EUA). This EUA will remain in effect (meaning this test can be used) for the duration of the COVID-19 declaration under Section 564(b)(1) of the Act, 21 U.S.C. section 360bbb-3(b)(1), unless the authorization is terminated or revoked.  Performed at [facility], [address], [place], [place] [address]    Studies/Results: No results found. Medications:  Scheduled Meds: . [START ON 04/25/2021] amiodarone  200 mg Oral BID  . amiodarone  400 mg Oral BID  . aspirin EC  81 mg Oral Daily  . atorvastatin  40 mg Oral Daily  . collagenase   Topical Daily  . feeding supplement  237 mL Oral BID BM  . ferrous sulfate  325 mg Oral BID WC  . folic acid  1 mg Oral Daily  . guaiFENesin  600 mg Oral BID  . melatonin  20 mg Oral QHS  . metoprolol tartrate  12.5 mg Oral BID  . pantoprazole  40 mg Oral Daily  . polyethylene glycol  17 g Oral Daily  . tamsulosin  0.4 mg Oral Daily  .  thiamine  100 mg Oral Daily   Continuous Infusions: PRN Meds:.chlorpheniramine-HYDROcodone, docusate sodium, HYDROcodone-acetaminophen, ipratropium-albuterol, menthol-cetylpyridinium   Assessment: Principal Problem:   Acute blood loss anemia Active Problems:   GIB (gastrointestinal bleeding)   Acute kidney injury superimposed on CKD (HCC)   NSTEMI (non-ST elevated myocardial infarction) ([place])   Melena   Hiatal hernia   Duodenal erythema   Polyp of colon    Plan: Primary team was planning on restarting Eliquis but as per cardiology note, they might be holding off given that patient has had some low blood pressures today  Hemoglobin did drop slightly from yesterday but patient is overall hemodynamically stable  Continue close monitoring with serial CBCs, and monitoring clinical symptoms  Please page GI with any signs of active GI bleeding  Dr. Vicente Males will be on-call over the weekend and will be following the patient over the weekend   LOS: 6 days   Vonda Antigua, MD 04/21/2021, 4:23 PM

## 2021-04-21 NOTE — TOC Progression Note (Signed)
Transition of Care [facility]) - Progression Note    Patient Details  Name: Gabriel King MRN: [id number] Date of Birth: [birth year]  Transition of Care [facility]) CM/SW Contact  Eileen Stanford, LCSW Phone Number: 04/21/2021, 3:22 PM  Clinical Narrative:   CSW provided b/o to pt's spouse however, she did not decided stating pt is not ready to dc due to interaction with Cardiology per pt's spouse. CSW will continue to follow.    Expected Discharge Plan: [place] Barriers to Discharge: Continued Medical Work up  Expected Discharge Plan and Services Expected Discharge Plan: [place] In-house Referral: Clinical Social Work   Post Acute Care Choice: [place] Living arrangements for the past 2 months: Single Family Home                                       Social Determinants of Health (SDOH) Interventions    Readmission Risk Interventions No flowsheet data found.

## 2021-04-22 DIAGNOSIS — N179 Acute kidney failure, unspecified: Secondary | ICD-10-CM | POA: Diagnosis not present

## 2021-04-22 DIAGNOSIS — I480 Paroxysmal atrial fibrillation: Secondary | ICD-10-CM

## 2021-04-22 DIAGNOSIS — K921 Melena: Secondary | ICD-10-CM

## 2021-04-22 DIAGNOSIS — I214 Non-ST elevation (NSTEMI) myocardial infarction: Secondary | ICD-10-CM | POA: Diagnosis not present

## 2021-04-22 DIAGNOSIS — D62 Acute posthemorrhagic anemia: Secondary | ICD-10-CM | POA: Diagnosis not present

## 2021-04-22 LAB — BASIC METABOLIC PANEL
Anion gap: 4 — ABNORMAL LOW (ref 5–15)
BUN: 70 mg/dL — ABNORMAL HIGH (ref 8–23)
CO2: 21 mmol/L — ABNORMAL LOW (ref 22–32)
Calcium: 7.8 mg/dL — ABNORMAL LOW (ref 8.9–10.3)
Chloride: 115 mmol/L — ABNORMAL HIGH (ref 98–111)
Creatinine, Ser: 2.85 mg/dL — ABNORMAL HIGH (ref 0.61–1.24)
GFR, Estimated: 21 mL/min — ABNORMAL LOW (ref >60)
Glucose, Bld: 92 mg/dL (ref 70–99)
Potassium: 5 mmol/L (ref 3.5–5.1)
Sodium: 140 mmol/L (ref 135–145)

## 2021-04-22 LAB — MAGNESIUM: Magnesium: 1.9 mg/dL (ref 1.7–2.4)

## 2021-04-22 LAB — CBC
HCT: 30.4 % — ABNORMAL LOW (ref 39.0–52.0)
Hemoglobin: 9.4 g/dL — ABNORMAL LOW (ref 13.0–17.0)
MCH: 31.6 pg (ref 26.0–34.0)
MCHC: 30.9 g/dL (ref 30.0–36.0)
MCV: 102.4 fL — ABNORMAL HIGH (ref 80.0–100.0)
Platelets: 212 10*3/uL (ref 150–400)
RBC: 2.97 MIL/uL — ABNORMAL LOW (ref 4.22–5.81)
RDW: 19.9 % — ABNORMAL HIGH (ref 11.5–15.5)
WBC: 9.1 10*3/uL (ref 4.0–10.5)
nRBC: 0 % (ref 0.0–0.2)

## 2021-04-22 NOTE — Progress Notes (Signed)
[employer] [facility] [place] NAME: Gabriel King    MR#:  [id number]  DATE OF BIRTH:  02/01/1932  SUBJECTIVE:  patient had episode of low blood pressure yesterday. Improved with IV fluid bolus. Sitting upright in the bed eating breakfast. Slept on and off last night no other issues overall feeling better. No report of G.I. bleed  REVIEW OF SYSTEMS:   Review of Systems  Constitutional: Negative for chills, fever and weight loss.  HENT: Negative for ear discharge, ear pain and nosebleeds.   Eyes: Negative for blurred vision, pain and discharge.  Respiratory: Negative for sputum production, shortness of breath, wheezing and stridor.   Cardiovascular: Negative for chest pain, palpitations, orthopnea and PND.  Gastrointestinal: Negative for abdominal pain, diarrhea, nausea and vomiting.  Genitourinary: Negative for frequency and urgency.  Musculoskeletal: Negative for back pain and joint pain.  Neurological: Positive for weakness. Negative for sensory change, speech change and focal weakness.  Psychiatric/Behavioral: Negative for depression and hallucinations. The patient is not nervous/anxious.    Tolerating Diet:yes Tolerating PT: rehab  DRUG ALLERGIES:   Allergies  Allergen Reactions  . Bactrim [Sulfamethoxazole-Trimethoprim]   . Cefpodoxime Swelling  . Ciprofloxacin   . Ertapenem Anxiety and Other (See Comments)    VITALS:  Blood pressure 116/61, pulse 73, temperature (!) 97 F (36.1 C), resp. rate 17, height '5\' 11"'$  (1.803 m), weight 82.1 kg, SpO2 100 %.  PHYSICAL EXAMINATION:   Physical Exam  GENERAL:  87 y.o.-year-old patient lying in the bed with no acute distress.  LUNGS: Normal breath sounds bilaterally, no wheezing, rales, rhonchi. No use of accessory muscles of respiration.  CARDIOVASCULAR: S1, S2 normal. No murmurs, rubs, or gallops.  ABDOMEN: Soft, nontender, nondistended. Bowel sounds present. No organomegaly or mass.   EXTREMITIES: No cyanosis, clubbing or edema b/l.    NEUROLOGIC: Cranial nerves II through XII are intact. No focal Motor or sensory deficits b/l.   PSYCHIATRIC:  patient is alert and oriented x 3.  SKIN: No obvious rash, lesion, or ulcer.   LABORATORY PANEL:  CBC Recent Labs  Lab 04/22/21 0416  WBC 9.1  HGB 9.4*  HCT 30.4*  PLT 212    Chemistries  Recent Labs  Lab 04/18/21 0427 04/19/21 0412 04/22/21 0416  NA 135   < > 140  K 4.8   < > 5.0  CL 109   < > 115*  CO2 19*   < > 21*  GLUCOSE 104*   < > 92  BUN 114*   < > 70*  CREATININE 3.49*   < > 2.85*  CALCIUM 7.8*   < > 7.8*  MG 1.8   < > 1.9  AST 20  --   --   ALT 14  --   --   ALKPHOS 79  --   --   BILITOT 0.8  --   --    < > = values in this interval not displayed.   Cardiac Enzymes No results for input(s): TROPONINI in the last 168 hours. RADIOLOGY:  No results found. ASSESSMENT AND PLAN:   87 year old male with history of CAD, multiple PCI and stents, chronic CHF, COPD, former smoker, CKD 4, paroxysmal atrial fibrillation on Eliquis, resident of [place], currently visiting [place] for his grandsons graduation/church celebration, presented to the emergency room 5/14 with chest pain, worsening dyspnea on exertion for few weeks, melena. In the ER he was noted to have a hemoglobin of 4, creatinine  of 3.7, high-sensitivity Troponin of 10,000.  G.I. bleed-- etiology unclear severe acute blood loss anemia -- patient presented with increasing shortness of breath weakness hemoglobin of 4.0--- four unit blood transfusion-- 9.3 -- EGD White nummular lesions in esophageal mucosa.                         Brushings performed.                        - Non-obstructing Schatzki ring.                        - Medium-sized hiatal hernia.                        - Erythematous duodenopathy. -- Colonoscopy - Preparation of the colon was poor.                        - Many 4 to 7 mm, non-bleeding polyps in the sigmoid                          colon and in the transverse colon.                        - Diverticulosis in the entire examined colon.                        - Blood in the entire examined colon.                        - The examination was otherwise normal.                        - Non-bleeding internal hemorrhoids. -- G.I. Dr. Bonna Gains recommends capsule endoscopy which is being performed. --capsule endoscopy--inconclusive --ok to reume low dose Eliquis per GI since no obvious source of bleeding identified --hgb 9.4  Hypotension--transient --resolved after IVF bolus  NSTEMI In the setting of G.I. bleed -- high-sensitivity troponin peaked 10,000-- patient has history of CAD with PCI to diagonal in 2011. Follows with cardiology in [place]. -- Per [facility] cardiology continue aspirin, Lipitor 40 mg daily.-- Cardiology does not recommend left heart cath due to active bleeding. -- stable blood pressure   Paroxysmal atrial fibrillation -- maintaining sinus rhythm --on  amiodarone 400 mg BID--change to 200 mg bid  From 04/25/2021 -- holding eliquis due to G.I. bleed but resumed for 1 days and hgb dropped a bit --d/w Dr Rockey Situ hold eliquis for atleast 2 weeks  COPD -- stable  Generalized weakness with deconditioning -- PT recommends rehab. TOC for discharge planning.  Procedures: EGD, colonoscopy, capsule endoscopy Family communication : wife at bedside Consults : cardiology, G.I. CODE STATUS: DNR DVT Prophylaxis : eliquis Level of care: Med-Surg Status is: Inpatient   Dispo: The patient is from: Home              Anticipated d/c is to SNF likely monday              Patient currently medically optimized   Difficult to place patient No  Will discharged to rehab once bed available. Patient and wife agreeable.     TOTAL TIME TAKING CARE OF THIS PATIENT: 25 minutes.  >  50% time spent on counselling and coordination of care  Note: This dictation was prepared with Dragon dictation along with  smaller phrase technology. Any transcriptional errors that result from this process are unintentional.  [name] M.D    [facility]   CC: Primary care physician; System, Provider Not InPatient ID: Gabriel King, male   DOB: 04-26-1932, 87 y.o.   MRN: [id number]

## 2021-04-22 NOTE — Progress Notes (Signed)
Progress Note  Patient Name: Gabriel King Date of Encounter: 04/22/2021  [facility] Cardiologist: Cardiology in [place] breathing is a little better. Still has trouble laying flat. hgb today 9.4. No chest pain. Converted to Afib overnight, rates controlled.   Inpatient Medications    Scheduled Meds: . [START ON 04/25/2021] amiodarone  200 mg Oral BID  . amiodarone  400 mg Oral BID  . aspirin EC  81 mg Oral Daily  . atorvastatin  40 mg Oral Daily  . collagenase   Topical Daily  . feeding supplement  237 mL Oral BID BM  . ferrous sulfate  325 mg Oral BID WC  . folic acid  1 mg Oral Daily  . guaiFENesin  600 mg Oral BID  . melatonin  20 mg Oral QHS  . metoprolol tartrate  12.5 mg Oral BID  . pantoprazole  40 mg Oral Daily  . polyethylene glycol  17 g Oral Daily  . tamsulosin  0.4 mg Oral Daily  . thiamine  100 mg Oral Daily   Continuous Infusions:  PRN Meds: acetaminophen, chlorpheniramine-HYDROcodone, docusate sodium, HYDROcodone-acetaminophen, ipratropium-albuterol, menthol-cetylpyridinium   Vital Signs    Vitals:   04/22/21 0255 04/22/21 0409 04/22/21 0748 04/22/21 0800  BP:  139/67 (!) 130/59   Pulse:  65 63   Resp:  18 17   Temp:  97.8 F (36.6 C) 97.9 F (36.6 C)   TempSrc:  Oral    SpO2:  100% 100% 100%  Weight: 82.1 kg     Height:        Intake/Output Summary (Last 24 hours) at 04/22/2021 1005 Last data filed at 04/22/2021 0409 Gross per 24 hour  Intake --  Output 900 ml  Net -900 ml   Last 3 Weights 04/22/2021 04/20/2021 04/19/2021  Weight (lbs) 181 lb 181 lb 10.5 oz 178 lb 5.6 oz  Weight (kg) 82.101 kg 82.4 kg 80.9 kg      Telemetry    No new - Personally Reviewed  ECG    SB>afib, HR 80-90s, PVCs - Personally Reviewed  Physical Exam   GEN: No acute distress.   Neck: No JVD Cardiac: Irreg Irreg, no murmurs, rubs, or gallops.  Respiratory: course breath sounds, wheezing GI: Soft, nontender, non-distended  MS: trace  edema; No deformity. Neuro:  Nonfocal  Psych: Normal affect   Labs    High Sensitivity Troponin:   Recent Labs  Lab 04/15/21 0310 04/15/21 0752 04/15/21 1042 04/16/21 1023 04/16/21 1216  TROPONINIHS 535* 6,671* 10,535* 7,737* 7,692*      Chemistry Recent Labs  Lab 04/18/21 0427 04/19/21 0412 04/20/21 0339 04/21/21 0339 04/22/21 0416  NA 135 138 140  --  140  K 4.8 5.2* 5.2* 4.9 5.0  CL 109 112* 116*  --  115*  CO2 19* 19* 17*  --  21*  GLUCOSE 104* 94 92  --  92  BUN 114* 100* 84*  --  70*  CREATININE 3.49* 3.34* 3.06*  --  2.85*  CALCIUM 7.8* 7.9* 7.6*  --  7.8*  PROT 4.3*  --   --   --   --   ALBUMIN 2.2*  --   --   --   --   AST 20  --   --   --   --   ALT 14  --   --   --   --   ALKPHOS 79  --   --   --   --  BILITOT 0.8  --   --   --   --   GFRNONAA 16* 17* 19*  --  21*  ANIONGAP '7 7 7  '$ --  4*     Hematology Recent Labs  Lab 04/18/21 0427 04/19/21 0412 04/21/21 0339 04/22/21 0416  WBC 12.3* 10.6*  --  9.1  RBC 2.59* 2.96*  --  2.97*  HGB 8.2* 9.3* 8.2* 9.4*  HCT 25.7* 29.1*  --  30.4*  MCV 99.2 98.3  --  102.4*  MCH 31.7 31.4  --  31.6  MCHC 31.9 32.0  --  30.9  RDW 21.2* 21.2*  --  19.9*  PLT 188 209  --  212    BNP Recent Labs  Lab 04/15/21 1442  BNP 650.6*     DDimer No results for input(s): DDIMER in the last 168 hours.   Radiology    No results found.  Cardiac Studies   Echo 04/16/21 1. Left ventricular ejection fraction, by estimation, is 45 to 50%. The  left ventricle has mildly decreased function. The left ventricle  demonstrates regional wall motion abnormalities (Hypokinesis of the mid to  distal anterior and anteroseptal and  apical region). There is mild left ventricular hypertrophy. Left  ventricular diastolic parameters are indeterminate.  2. Right ventricular systolic function is normal. The right ventricular  size is normal. There is normal pulmonary artery systolic pressure. The  estimated right ventricular  systolic pressure is 123XX123 mmHg.  3. Left atrial size was mildly dilated.  4. The inferior vena cava is dilated in size with >50% respiratory  variability, suggesting right atrial pressure of 8 mmHg.   Patient Profile     87 y.o. male with h/o CAD s/p PCI to [facility] in 2011 followed by cardiologist in [place], Paroxysmal Afib, CHF, HTN, HLD, CKD stage 3, COPD, previous tobacco use, alcohol use, anemia on iron who is being seen for NSTEMI in the setting of GIB.   Assessment & Plan    Severe Anemia/GIB - presented on 5/14 with SOB and chest tightness found to have Hgb 4 and elevated troponin - FOBT positive - s/p 4 units PRBCs.  - Eliquis held.  - cath held for severe anemia. ASA continued - GI following - EGD and colonoscopy performed. GI recommend capsule endoscopy, which was insonclusive - retrial of Eliquis and Hgb dropped. Recommendations for holding Eliquis for 2 weeks vs undergo further GI work-up  NSTEMI in the setting of severe anemia/GIB H/o of CAD with prior PCI in 2011 - HS trop elevated to 10,000, cath held for anemia as above, also has CKD with creatinine of 3 - home meds continued, lipitor 40 mg daily - Echo showed mildly reduced EF 45-50% - BB held for hypotension>Lopressor 12.'5mg'$  BID restarted - Further OP ischemic work-up can be with cardiologist in [place], we can see in the interim   HFmrEF - echo this admission showed EF 45-50% - home torsemide and BB held for hypotension - trace edema on exam, consider restart home torsemide - Pressures better, restart BB - No Ace/ARB/ARNI with CKD  Paroxysmal Afib - was in SR, converted to Afib this AM - He was on amiodarone '400mg'$  BID only 4/5 days, was switched prematurely to 200 mgBID. Go back up to '400mg'$  BID - PTA Eliquis 2.'5mg'$  BID>> held for GIB, retrial with Hgb dropped - CHADSVASC at least 5 (CAD, HTN, CHF, AGEx2) - Consider restarting Eliquis after 2 weeks vs further GI work-up - continue BB  COPD -  no acute  exacerbation  Alcohol use - 1 drink daily  CKD stage 3 - Creatinine around baseline of 3  GOC - He is from [place], only visiting  - planning on outpatient rehab discharge here in [place]  For questions or updates, please contact [facility] Please consult [email or web address] for contact info under        Signed, Macie Baum Ninfa Meeker, PA-C  04/22/2021, 10:05 AM

## 2021-04-22 NOTE — Plan of Care (Signed)
Pt still reports feeling weak. BP improved from 5/20 this am 139/67. Hgb improved from 8.2 to 9.4 this am. Pt rested well during the overnight. No prns given. Pt had bm 5/20 at hs. Voiding using urinal no complaints of unable to void this shift. Problem: Education: Goal: Knowledge of General Education information will improve Description: Including pain rating scale, medication(s)/side effects and non-pharmacologic comfort measures Outcome: Progressing   Problem: Health Behavior/Discharge Planning: Goal: Ability to manage health-related needs will improve Outcome: Progressing   Problem: Clinical Measurements: Goal: Ability to maintain clinical measurements within normal limits will improve Outcome: Progressing Goal: Will remain free from infection Outcome: Progressing Goal: Diagnostic test results will improve Outcome: Progressing Goal: Respiratory complications will improve Outcome: Progressing Goal: Cardiovascular complication will be avoided Outcome: Progressing   Problem: Activity: Goal: Risk for activity intolerance will decrease Outcome: Progressing   Problem: Nutrition: Goal: Adequate nutrition will be maintained Outcome: Progressing   Problem: Coping: Goal: Level of anxiety will decrease Outcome: Progressing   Problem: Elimination: Goal: Will not experience complications related to bowel motility Outcome: Progressing Goal: Will not experience complications related to urinary retention Outcome: Progressing   Problem: Pain Managment: Goal: General experience of comfort will improve Outcome: Progressing   Problem: Safety: Goal: Ability to remain free from injury will improve Outcome: Progressing   Problem: Skin Integrity: Goal: Risk for impaired skin integrity will decrease Outcome: Progressing

## 2021-04-22 NOTE — Progress Notes (Signed)
Gabriel King , MD [address] [place], [place], [name], [address] [address], [place], [place], [name], [address] Phone: [phone]  Fax: [phone]   Gabriel King is being followed for overt obscure GI bleed  Subjective: Feels well , in fact feels the best since admission. Was having a bowel movement when I went in and was brown in color.    Objective: Vital signs in last 24 hours: Vitals:   04/22/21 0255 04/22/21 0409 04/22/21 0748 04/22/21 0800  BP:  139/67 (!) 130/59   Pulse:  65 63   Resp:  18 17   Temp:  97.8 F (36.6 C) 97.9 F (36.6 C)   TempSrc:  Oral    SpO2:  100% 100% 100%  Weight: 82.1 kg     Height:       Weight change:   Intake/Output Summary (Last 24 hours) at 04/22/2021 [id number] Last data filed at 04/22/2021 0409 Gross per 24 hour  Intake --  Output 1075 ml  Net -1075 ml     Exam: Neuro; alert and orienattaed x 3    Lab Results: '@LABTEST2'$ @ Micro Results: Recent Results (from the past 240 hour(s))  Culture, blood (Routine X 2) w Reflex to ID Panel     Status: None   Collection Time: 04/15/21  1:51 AM   Specimen: BLOOD  Result Value Ref Range Status   Specimen Description BLOOD BLOOD RIGHT FOREARM  Final   Special Requests   Final    BOTTLES DRAWN AEROBIC AND ANAEROBIC Blood Culture adequate volume   Culture   Final    NO GROWTH 5 DAYS Performed at [facility], [address] [place], [place] [address]    Report Status 04/20/2021 FINAL  Final  Culture, blood (Routine X 2) w Reflex to ID Panel     Status: None   Collection Time: 04/15/21  1:52 AM   Specimen: BLOOD  Result Value Ref Range Status   Specimen Description BLOOD RIGHT ANTECUBITAL  Final   Special Requests   Final    BOTTLES DRAWN AEROBIC AND ANAEROBIC Blood Culture adequate volume   Culture   Final    NO GROWTH 5 DAYS Performed at [facility], [address] [place], [place] [address]    Report Status 04/20/2021 FINAL  Final  Urine Culture      Status: None   Collection Time: 04/15/21  3:26 AM   Specimen: Urine, Random  Result Value Ref Range Status   Specimen Description   Final    URINE, RANDOM Performed at [facility], [address], [place], [place] [address]    Special Requests   Final    NONE Performed at [facility], [address], [place], [place] [address]    Culture   Final    NO GROWTH Performed at [facility], [place] [address], [place], [place] [address]    Report Status 04/16/2021 FINAL  Final  Resp Panel by RT-PCR (Flu A&B, Covid)     Status: None   Collection Time: 04/15/21  4:59 AM   Specimen: Nasopharyngeal(NP) swabs in vial transport medium  Result Value Ref Range Status   SARS Coronavirus 2 by RT PCR NEGATIVE NEGATIVE Final    Comment: (NOTE) SARS-CoV-2 target nucleic acids are NOT DETECTED.  The SARS-CoV-2 RNA is generally detectable in upper respiratory specimens during the acute phase of infection. The lowest concentration of SARS-CoV-2 viral copies this assay can detect is 138 copies/mL.  A negative result does not preclude SARS-Cov-2 infection and should not be used as the sole basis for treatment or other patient management decisions. A negative result may occur with  improper specimen collection/handling, submission of specimen other than nasopharyngeal swab, presence of viral mutation(s) within the areas targeted by this assay, and inadequate number of viral copies(<138 copies/mL). A negative result must be combined with clinical observations, patient history, and epidemiological information. The expected result is Negative.  Fact Sheet for Patients:  EntrepreneurPulse.com.au  Fact Sheet for Healthcare Providers:  IncredibleEmployment.be  This test is no t yet approved or cleared by the [place] FDA and  has been authorized for detection and/or diagnosis of SARS-CoV-2 by FDA under an Emergency Use Authorization  (EUA). This EUA will remain  in effect (meaning this test can be used) for the duration of the COVID-19 declaration under Section 564(b)(1) of the Act, 21 U.S.C.section 360bbb-3(b)(1), unless the authorization is terminated  or revoked sooner.       Influenza A by PCR NEGATIVE NEGATIVE Final   Influenza B by PCR NEGATIVE NEGATIVE Final    Comment: (NOTE) The Xpert Xpress SARS-CoV-2/FLU/RSV plus assay is intended as an aid in the diagnosis of influenza from Nasopharyngeal swab specimens and should not be used as a sole basis for treatment. Nasal washings and aspirates are unacceptable for Xpert Xpress SARS-CoV-2/FLU/RSV testing.  Fact Sheet for Patients: EntrepreneurPulse.com.au  Fact Sheet for Healthcare Providers: IncredibleEmployment.be  This test is not yet approved or cleared by the [place] FDA and has been authorized for detection and/or diagnosis of SARS-CoV-2 by FDA under an Emergency Use Authorization (EUA). This EUA will remain in effect (meaning this test can be used) for the duration of the COVID-19 declaration under Section 564(b)(1) of the Act, 21 U.S.C. section 360bbb-3(b)(1), unless the authorization is terminated or revoked.  Performed at [facility], [address] [place], [place] [address]   MRSA PCR Screening     Status: Abnormal   Collection Time: 04/15/21  6:13 AM   Specimen: Nasopharyngeal  Result Value Ref Range Status   MRSA by PCR POSITIVE (A) NEGATIVE Final    Comment:        The GeneXpert MRSA Assay (FDA approved for NASAL specimens only), is one component of a comprehensive MRSA colonization surveillance program. It is not intended to diagnose MRSA infection nor to guide or monitor treatment for MRSA infections. RESULT CALLED TO, READ BACK BY AND VERIFIED WITH: [facility] FURR AT [id number] 04/15/21.PMF Performed at [facility], [address] [place], [place] [address]   KOH prep      Status: None   Collection Time: 04/18/21 12:46 PM   Specimen: Esophagus  Result Value Ref Range Status   Specimen Description ESOPHAGUS  Final   Special Requests Normal  Final   KOH Prep   Final    YEAST WITH PSEUDOHYPHAE Performed at [facility], [address] [place],  [address]    Report Status 04/18/2021 FINAL  Final  Resp Panel by RT-PCR (Flu A&B, Covid) Nasopharyngeal Swab     Status: None   Collection Time: 04/21/21 11:59 AM   Specimen: Nasopharyngeal Swab; Nasopharyngeal(NP) swabs in vial transport medium  Result Value Ref Range Status   SARS Coronavirus 2 by RT PCR NEGATIVE NEGATIVE Final    Comment: (NOTE) SARS-CoV-2 target nucleic acids are NOT DETECTED.  The SARS-CoV-2 RNA is generally detectable in upper respiratory specimens during the acute phase of infection. The lowest concentration  of SARS-CoV-2 viral copies this assay can detect is 138 copies/mL. A negative result does not preclude SARS-Cov-2 infection and should not be used as the sole basis for treatment or other patient management decisions. A negative result may occur with  improper specimen collection/handling, submission of specimen other than nasopharyngeal swab, presence of viral mutation(s) within the areas targeted by this assay, and inadequate number of viral copies(<138 copies/mL). A negative result must be combined with clinical observations, patient history, and epidemiological information. The expected result is Negative.  Fact Sheet for Patients:  EntrepreneurPulse.com.au  Fact Sheet for Healthcare Providers:  IncredibleEmployment.be  This test is no t yet approved or cleared by the [place] FDA and  has been authorized for detection and/or diagnosis of SARS-CoV-2 by FDA under an Emergency Use Authorization (EUA). This EUA will remain  in effect (meaning this test can be used) for the duration of the COVID-19 declaration under  Section 564(b)(1) of the Act, 21 U.S.C.section 360bbb-3(b)(1), unless the authorization is terminated  or revoked sooner.       Influenza A by PCR NEGATIVE NEGATIVE Final   Influenza B by PCR NEGATIVE NEGATIVE Final    Comment: (NOTE) The Xpert Xpress SARS-CoV-2/FLU/RSV plus assay is intended as an aid in the diagnosis of influenza from Nasopharyngeal swab specimens and should not be used as a sole basis for treatment. Nasal washings and aspirates are unacceptable for Xpert Xpress SARS-CoV-2/FLU/RSV testing.  Fact Sheet for Patients: EntrepreneurPulse.com.au  Fact Sheet for Healthcare Providers: IncredibleEmployment.be  This test is not yet approved or cleared by the [place] FDA and has been authorized for detection and/or diagnosis of SARS-CoV-2 by FDA under an Emergency Use Authorization (EUA). This EUA will remain in effect (meaning this test can be used) for the duration of the COVID-19 declaration under Section 564(b)(1) of the Act, 21 U.S.C. section 360bbb-3(b)(1), unless the authorization is terminated or revoked.  Performed at [facility], [address], [place], [place] [address]    Studies/Results: No results found. Medications: I have reviewed the patient's current medications. Scheduled Meds: . [START ON 04/25/2021] amiodarone  200 mg Oral BID  . amiodarone  400 mg Oral BID  . aspirin EC  81 mg Oral Daily  . atorvastatin  40 mg Oral Daily  . collagenase   Topical Daily  . feeding supplement  237 mL Oral BID BM  . ferrous sulfate  325 mg Oral BID WC  . folic acid  1 mg Oral Daily  . guaiFENesin  600 mg Oral BID  . melatonin  20 mg Oral QHS  . metoprolol tartrate  12.5 mg Oral BID  . pantoprazole  40 mg Oral Daily  . polyethylene glycol  17 g Oral Daily  . tamsulosin  0.4 mg Oral Daily  . thiamine  100 mg Oral Daily   Continuous Infusions: PRN Meds:.acetaminophen, chlorpheniramine-HYDROcodone, docusate  sodium, HYDROcodone-acetaminophen, ipratropium-albuterol, menthol-cetylpyridinium   Assessment: Principal Problem:   Acute blood loss anemia Active Problems:   GIB (gastrointestinal bleeding)   Acute kidney injury superimposed on CKD (HCC)   NSTEMI (non-ST elevated myocardial infarction) (HCC)   Melena   Hiatal hernia   Duodenal erythema   Polyp of colon  Leeroy Bock 87 y.o. male being followed for a GI bleed. EGD+colonoscopy+capsule study showed no active bleeding source, possible diverticular bleed. HB has been stable. Had a bowel movement when I was in the room and seen by myself was brown in color.  F/u with Dr Marius Ditch as  an outpatient. Home on PPI especially if restarting eloquis    I will sign off.  Please call me if any further GI concerns or questions.  We would like to thank you for the opportunity to participate in the care of Gabriel King.     LOS: 7 days   Gabriel Bellows, MD 04/22/2021, 9:09 AM

## 2021-04-23 DIAGNOSIS — I5041 Acute combined systolic (congestive) and diastolic (congestive) heart failure: Secondary | ICD-10-CM | POA: Diagnosis not present

## 2021-04-23 DIAGNOSIS — I480 Paroxysmal atrial fibrillation: Secondary | ICD-10-CM | POA: Diagnosis not present

## 2021-04-23 DIAGNOSIS — K921 Melena: Secondary | ICD-10-CM | POA: Diagnosis not present

## 2021-04-23 DIAGNOSIS — I214 Non-ST elevation (NSTEMI) myocardial infarction: Secondary | ICD-10-CM | POA: Diagnosis not present

## 2021-04-23 DIAGNOSIS — D62 Acute posthemorrhagic anemia: Secondary | ICD-10-CM | POA: Diagnosis not present

## 2021-04-23 LAB — CBC
HCT: 26.4 % — ABNORMAL LOW (ref 39.0–52.0)
Hemoglobin: 8.2 g/dL — ABNORMAL LOW (ref 13.0–17.0)
MCH: 31.8 pg (ref 26.0–34.0)
MCHC: 31.1 g/dL (ref 30.0–36.0)
MCV: 102.3 fL — ABNORMAL HIGH (ref 80.0–100.0)
Platelets: 230 10*3/uL (ref 150–400)
RBC: 2.58 MIL/uL — ABNORMAL LOW (ref 4.22–5.81)
RDW: 19.6 % — ABNORMAL HIGH (ref 11.5–15.5)
WBC: 11.6 10*3/uL — ABNORMAL HIGH (ref 4.0–10.5)
nRBC: 0 % (ref 0.0–0.2)

## 2021-04-23 LAB — BASIC METABOLIC PANEL
Anion gap: 5 (ref 5–15)
BUN: 70 mg/dL — ABNORMAL HIGH (ref 8–23)
CO2: 20 mmol/L — ABNORMAL LOW (ref 22–32)
Calcium: 7.7 mg/dL — ABNORMAL LOW (ref 8.9–10.3)
Chloride: 111 mmol/L (ref 98–111)
Creatinine, Ser: 2.75 mg/dL — ABNORMAL HIGH (ref 0.61–1.24)
GFR, Estimated: 22 mL/min — ABNORMAL LOW (ref >60)
Glucose, Bld: 132 mg/dL — ABNORMAL HIGH (ref 70–99)
Potassium: 5.1 mmol/L (ref 3.5–5.1)
Sodium: 136 mmol/L (ref 135–145)

## 2021-04-23 MED ORDER — VITAMIN B-12 1000 MCG PO TABS
1000.0000 ug | ORAL_TABLET | Freq: Every day | ORAL | Status: DC
  Administered 2021-04-23 – 2021-04-24 (×2): 1000 ug via ORAL
  Filled 2021-04-23 (×2): qty 1

## 2021-04-23 MED ORDER — MAGNESIUM OXIDE -MG SUPPLEMENT 400 (240 MG) MG PO TABS
400.0000 mg | ORAL_TABLET | Freq: Every day | ORAL | Status: DC
  Administered 2021-04-23 – 2021-04-24 (×2): 400 mg via ORAL
  Filled 2021-04-23 (×2): qty 1

## 2021-04-23 MED ORDER — TORSEMIDE 20 MG PO TABS
20.0000 mg | ORAL_TABLET | Freq: Every day | ORAL | Status: DC
  Administered 2021-04-23 – 2021-04-24 (×2): 20 mg via ORAL
  Filled 2021-04-23 (×2): qty 1

## 2021-04-23 MED ORDER — FINASTERIDE 5 MG PO TABS
5.0000 mg | ORAL_TABLET | Freq: Every day | ORAL | Status: DC
  Administered 2021-04-23 – 2021-04-24 (×2): 5 mg via ORAL
  Filled 2021-04-23 (×2): qty 1

## 2021-04-23 MED ORDER — ALBUTEROL SULFATE HFA 108 (90 BASE) MCG/ACT IN AERS
1.0000 | INHALATION_SPRAY | Freq: Four times a day (QID) | RESPIRATORY_TRACT | Status: DC
  Administered 2021-04-23 – 2021-04-24 (×3): 1 via RESPIRATORY_TRACT
  Filled 2021-04-23 (×2): qty 6.7

## 2021-04-23 NOTE — Progress Notes (Signed)
Progress Note  Patient Name: Gabriel King Date of Encounter: 04/23/2021  [facility] Cardiologist: Cardiology in [place] function better today, needs torsemide but Bps soft. Hgb stable at 9.4. He feels breathing treatment yesterday improved breathing. He is in SR/SB this AM. Has left foot pain from heel wound.   Inpatient Medications    Scheduled Meds: . [START ON 04/25/2021] amiodarone  200 mg Oral BID  . amiodarone  400 mg Oral BID  . aspirin EC  81 mg Oral Daily  . atorvastatin  40 mg Oral Daily  . collagenase   Topical Daily  . feeding supplement  237 mL Oral BID BM  . ferrous sulfate  325 mg Oral BID WC  . folic acid  1 mg Oral Daily  . guaiFENesin  600 mg Oral BID  . melatonin  20 mg Oral QHS  . metoprolol tartrate  12.5 mg Oral BID  . pantoprazole  40 mg Oral Daily  . polyethylene glycol  17 g Oral Daily  . tamsulosin  0.4 mg Oral Daily  . thiamine  100 mg Oral Daily   Continuous Infusions:  PRN Meds: acetaminophen, chlorpheniramine-HYDROcodone, docusate sodium, HYDROcodone-acetaminophen, ipratropium-albuterol, menthol-cetylpyridinium   Vital Signs    Vitals:   04/22/21 1607 04/22/21 2010 04/23/21 0336 04/23/21 0500  BP: 99/60 97/64 (!) 97/48   Pulse: 62 73 66   Resp: '14 20 18   '$ Temp: 98.1 F (36.7 C) 98.1 F (36.7 C) 98.4 F (36.9 C)   TempSrc:  Oral Oral   SpO2: 100% 97% 100%   Weight:    84.1 kg  Height:        Intake/Output Summary (Last 24 hours) at 04/23/2021 0717 Last data filed at 04/23/2021 0100 Gross per 24 hour  Intake --  Output 400 ml  Net -400 ml   Last 3 Weights 04/23/2021 04/22/2021 04/20/2021  Weight (lbs) 185 lb 6.5 oz 181 lb 181 lb 10.5 oz  Weight (kg) 84.1 kg 82.101 kg 82.4 kg      Telemetry    Aifb>SB/SR, PVCs, HR 50-60s - Personally Reviewed  ECG    No new - Personally Reviewed  Physical Exam   GEN: No acute distress.   Neck: No JVD Cardiac: RRR, no murmurs, rubs, or gallops.  Respiratory:  Clear to auscultation bilaterally. GI: Soft, nontender, non-distended  MS: trace edema; No deformity. Neuro:  Nonfocal  Psych: Normal affect   Labs    High Sensitivity Troponin:   Recent Labs  Lab 04/15/21 0310 04/15/21 0752 04/15/21 1042 04/16/21 1023 04/16/21 1216  TROPONINIHS 535* 6,671* 10,535* 7,737* 7,692*      Chemistry Recent Labs  Lab 04/18/21 0427 04/19/21 0412 04/20/21 0339 04/21/21 0339 04/22/21 0416  NA 135 138 140  --  140  K 4.8 5.2* 5.2* 4.9 5.0  CL 109 112* 116*  --  115*  CO2 19* 19* 17*  --  21*  GLUCOSE 104* 94 92  --  92  BUN 114* 100* 84*  --  70*  CREATININE 3.49* 3.34* 3.06*  --  2.85*  CALCIUM 7.8* 7.9* 7.6*  --  7.8*  PROT 4.3*  --   --   --   --   ALBUMIN 2.2*  --   --   --   --   AST 20  --   --   --   --   ALT 14  --   --   --   --  ALKPHOS 79  --   --   --   --   BILITOT 0.8  --   --   --   --   GFRNONAA 16* 17* 19*  --  21*  ANIONGAP '7 7 7  '$ --  4*     Hematology Recent Labs  Lab 04/18/21 0427 04/19/21 0412 04/21/21 0339 04/22/21 0416  WBC 12.3* 10.6*  --  9.1  RBC 2.59* 2.96*  --  2.97*  HGB 8.2* 9.3* 8.2* 9.4*  HCT 25.7* 29.1*  --  30.4*  MCV 99.2 98.3  --  102.4*  MCH 31.7 31.4  --  31.6  MCHC 31.9 32.0  --  30.9  RDW 21.2* 21.2*  --  19.9*  PLT 188 209  --  212    BNPNo results for input(s): BNP, PROBNP in the last 168 hours.   DDimer No results for input(s): DDIMER in the last 168 hours.   Radiology    No results found.  Cardiac Studies    Echo 04/16/21 1. Left ventricular ejection fraction, by estimation, is 45 to 50%. The  left ventricle has mildly decreased function. The left ventricle  demonstrates regional wall motion abnormalities (Hypokinesis of the mid to  distal anterior and anteroseptal and  apical region). There is mild left ventricular hypertrophy. Left  ventricular diastolic parameters are indeterminate.  2. Right ventricular systolic function is normal. The right ventricular  size is  normal. There is normal pulmonary artery systolic pressure. The  estimated right ventricular systolic pressure is 123XX123 mmHg.  3. Left atrial size was mildly dilated.  4. The inferior vena cava is dilated in size with >50% respiratory  variability, suggesting right atrial pressure of 8 mmHg.   Patient Profile     87 y.o. male with h/o CAD s/p PCI to [place] in 2011 followed by cardiologist in [place], Paroxysmal Afib, CHF, HTN, HLD, CKD stage 3, COPD, previous tobacco use, alcohol use, anemia on iron who is being seen for NSTEMI in the setting of GIB.  Assessment & Plan    Severe Anemia/GIB - presented on 5/14 with SOB and chest tightnessfound to have Hgb 4 and elevated troponin - FOBT positive -s/p 4 units PRBCs.  - Eliquis held.  - cath held for severe anemia. ASA continued - GI following - EGD and colonoscopy performed. GI recommend capsule endoscopy, which was insonclusive - retrial of Eliquis and Hgb dropped. Recommendations for holding Eliquis for 2 weeks vs undergo further GI work-up  NSTEMI in the setting of severe anemia/GIB H/o of CAD with prior PCI in 2011 - HS trop elevated to 10,000, cath held for anemiaas above, also has CKD with creatinine of 3 - continue lipitor 40 mg daily - Echo showed mildly reduced EF 45-50% - BB held for hypotension>Lopressor 12.'5mg'$  BID restarted. Pressures soft - Further OP ischemic work-up can be with cardiologist in [place], we can see in the interim   HFmrEF - echothis admissionshowed EF 45-50% - home torsemide and BB held for hypotension - Pressures better, restart BB - No Ace/ARB/ARNI with CKD - would restart torsemide '20mg'$  daily, however Bps soft this AM, maybe hold BB for resumption of torsemide  Paroxysmal Afib - In SR/SB this AM - Amiodarone '400mg'$  BID x 7 days< '200mg'$  BID x 7 days>'200mg'$  daily - PTA Eliquis 2.'5mg'$  BID>>held for GIB, retrial with Hgb dropped - CHADSVASC at least 5 (CAD, HTN, CHF, AGEx2) - Plan to restart  Eliquis after 2 weeks  - BB  COPD - no acute exacerbation - 2L O2  Alcohol use -1 drink daily  CKD stage 3 - Creatinine around baseline of 3  GOC - He is from [place], but planning on outpatient rehab discharge here in [place]  For questions or updates, please contact [place] Please consult [email or web address] for contact info under        Signed, Harrie Cazarez Ninfa Meeker, PA-C  04/23/2021, 7:17 AM

## 2021-04-23 NOTE — Progress Notes (Signed)
[place] [facility] [place] NAME: Gabriel King    MR#:  [id number]  DATE OF BIRTH:  03-01-1932  SUBJECTIVE:  Sitting upright in the bed eating breakfast. Slept on and off last night no other issues overall feeling better. No report of G.I. bleed BP soft today Some UE edema  REVIEW OF SYSTEMS:   Review of Systems  Constitutional: Negative for chills, fever and weight loss.  HENT: Negative for ear discharge, ear pain and nosebleeds.   Eyes: Negative for blurred vision, pain and discharge.  Respiratory: Negative for sputum production, shortness of breath, wheezing and stridor.   Cardiovascular: Negative for chest pain, palpitations, orthopnea and PND.  Gastrointestinal: Negative for abdominal pain, diarrhea, nausea and vomiting.  Genitourinary: Negative for frequency and urgency.  Musculoskeletal: Negative for back pain and joint pain.  Neurological: Positive for weakness. Negative for sensory change, speech change and focal weakness.  Psychiatric/Behavioral: Negative for depression and hallucinations. The patient is not nervous/anxious.    Tolerating Diet:yes Tolerating PT: rehab  DRUG ALLERGIES:   Allergies  Allergen Reactions  . Bactrim [Sulfamethoxazole-Trimethoprim]   . Cefpodoxime Swelling  . Ciprofloxacin   . Ertapenem Anxiety and Other (See Comments)    VITALS:  Blood pressure (!) 97/48, pulse 66, temperature 98.4 F (36.9 C), temperature source Oral, resp. rate 18, height '5\' 11"'$  (1.803 m), weight 84.1 kg, SpO2 100 %.  PHYSICAL EXAMINATION:   Physical Exam  GENERAL:  87 y.o.-year-old patient lying in the bed with no acute distress.  LUNGS: Normal breath sounds bilaterally, no wheezing, rales, rhonchi. No use of accessory muscles of respiration.  CARDIOVASCULAR: S1, S2 normal. No murmurs, rubs, or gallops.  ABDOMEN: Soft, nontender, nondistended. Bowel sounds present. No organomegaly or mass.  EXTREMITIES: No cyanosis,  clubbing or edema b/l.    NEUROLOGIC: Cranial nerves II through XII are intact. No focal Motor or sensory deficits b/l.   PSYCHIATRIC:  patient is alert and oriented x 3.  SKIN: chronic right heel ulcer  LABORATORY PANEL:  CBC Recent Labs  Lab 04/22/21 0416  WBC 9.1  HGB 9.4*  HCT 30.4*  PLT 212    Chemistries  Recent Labs  Lab 04/18/21 0427 04/19/21 0412 04/22/21 0416  NA 135   < > 140  K 4.8   < > 5.0  CL 109   < > 115*  CO2 19*   < > 21*  GLUCOSE 104*   < > 92  BUN 114*   < > 70*  CREATININE 3.49*   < > 2.85*  CALCIUM 7.8*   < > 7.8*  MG 1.8   < > 1.9  AST 20  --   --   ALT 14  --   --   ALKPHOS 79  --   --   BILITOT 0.8  --   --    < > = values in this interval not displayed.   Cardiac Enzymes No results for input(s): TROPONINI in the last 168 hours. RADIOLOGY:  No results found. ASSESSMENT AND PLAN:   87 year old male with history of CAD, multiple PCI and stents, chronic CHF, COPD, former smoker, CKD 4, paroxysmal atrial fibrillation on Eliquis, resident of [place], currently visiting [place] for his grandsons graduation/church celebration, presented to the emergency room 5/14 with chest pain, worsening dyspnea on exertion for few weeks, melena. In the ER he was noted to have a hemoglobin of 4, creatinine of 3.7, high-sensitivity Troponin of 10,000.  G.I. bleed-- etiology unclear severe acute blood loss anemia -- patient presented with increasing shortness of breath weakness hemoglobin of 4.0--- four unit blood transfusion-- 9.3 -- EGD White nummular lesions in esophageal mucosa.                         Brushings performed.                        - Non-obstructing Schatzki ring.                        - Medium-sized hiatal hernia.                        - Erythematous duodenopathy. -- Colonoscopy - Preparation of the colon was poor.                        - Many 4 to 7 mm, non-bleeding polyps in the sigmoid                         colon and in the  transverse colon.                        - Diverticulosis in the entire examined colon.                        - Blood in the entire examined colon.                        - The examination was otherwise normal.                        - Non-bleeding internal hemorrhoids. -- G.I. Dr. Bonna Gains recommends capsule endoscopy which is being performed. --capsule endoscopy--inconclusive --ok to reume low dose Eliquis per GI since no obvious source of bleeding identified --hgb 9.4  Hypotension--transient --resolved after IVF bolus --hold BB  NSTEMI In the setting of G.I. bleed -- high-sensitivity troponin peaked 10,000-- patient has history of CAD with PCI to [place] in 2011. Follows with cardiology in [place]. -- Per [facility] cardiology continue aspirin, Lipitor 40 mg daily.-- Cardiology does not recommend left heart cath due to active bleeding. -- BP soft--hold BB --resuming torsemide  Paroxysmal atrial fibrillation -- maintaining sinus rhythm --on  amiodarone 400 mg BID--change to 200 mg bid  From 04/25/2021 -- holding eliquis due to G.I. bleed but resumed for 1 days and hgb dropped a bit --d/w Dr Rockey Situ hold eliquis for atleast 2 weeks --holding BB due to soft bp  COPD -- stable  Generalized weakness with deconditioning -- PT recommends rehab. TOC for discharge planning.  Procedures: EGD, colonoscopy, capsule endoscopy Family communication : wife at bedside Consults : cardiology, G.I. CODE STATUS: DNR DVT Prophylaxis : eliquis Level of care: Med-Surg Status is: Inpatient   Dispo: The patient is from: Home              Anticipated d/c is to SNF likely monday              Patient currently medically optimized at best   Difficult to place patient No  Will discharged to rehab once bed available.     TOTAL TIME TAKING CARE OF THIS PATIENT: 25 minutes.  >  50% time spent on counselling and coordination of care  Note: This dictation was prepared with Dragon dictation along with  smaller phrase technology. Any transcriptional errors that result from this process are unintentional.  [name] M.D    [facility]   CC: Primary care physician; System, Provider Not InPatient ID: Gabriel King, male   DOB: May 14, 1932, 87 y.o.   MRN: [id number]

## 2021-04-23 NOTE — TOC Progression Note (Signed)
Transition of Care [facility]) - Progression Note    Patient Details  Name: Somtochukwu Hiracheta MRN: [id number] Date of Birth: 11-Feb-1932  Transition of Care [facility]) CM/SW Contact  Izola Price, RN Phone Number: 04/23/2021, 1:03 PM  Clinical Narrative:  [facility] accepted patient for SNF. Cardiology consult 5/21 rec. Several more days in acute care setting. EDD Monday or Tuesday. Simmie Davies RN CM    Expected Discharge Plan: Skilled Nursing Facility Barriers to Discharge: Continued Medical Work up  Expected Discharge Plan and Services Expected Discharge Plan: [place] In-house Referral: Clinical Social Work   Post Acute Care Choice: [place] Living arrangements for the past 2 months: Single Family Home                                       Social Determinants of Health (SDOH) Interventions    Readmission Risk Interventions No flowsheet data found.

## 2021-04-23 NOTE — Progress Notes (Signed)
Pt sitting up on side of bed eating lunch independently.  Family at bedside.  Pt was able to get to the side of the bed with very little help.

## 2021-04-23 NOTE — Progress Notes (Signed)
SVN given for SOB

## 2021-04-24 DIAGNOSIS — K3189 Other diseases of stomach and duodenum: Secondary | ICD-10-CM | POA: Diagnosis not present

## 2021-04-24 DIAGNOSIS — K922 Gastrointestinal hemorrhage, unspecified: Principal | ICD-10-CM

## 2021-04-24 DIAGNOSIS — D62 Acute posthemorrhagic anemia: Secondary | ICD-10-CM | POA: Diagnosis not present

## 2021-04-24 DIAGNOSIS — N179 Acute kidney failure, unspecified: Secondary | ICD-10-CM | POA: Diagnosis not present

## 2021-04-24 DIAGNOSIS — K921 Melena: Secondary | ICD-10-CM | POA: Diagnosis not present

## 2021-04-24 MED ORDER — GUAIFENESIN ER 600 MG PO TB12: 600.0000 mg | ORAL_TABLET | Freq: Two times a day (BID) | ORAL | 0 refills | Status: AC | PRN

## 2021-04-24 MED ORDER — AMIODARONE HCL 200 MG PO TABS: 200.0000 mg | ORAL_TABLET | Freq: Two times a day (BID) | ORAL | 2 refills | Status: DC

## 2021-04-24 MED ORDER — METOPROLOL TARTRATE 25 MG PO TABS
12.5000 mg | ORAL_TABLET | Freq: Two times a day (BID) | ORAL | Status: DC
  Filled 2021-04-24: qty 1

## 2021-04-24 MED ORDER — METOPROLOL TARTRATE 25 MG PO TABS: 12.5000 mg | ORAL_TABLET | Freq: Two times a day (BID) | ORAL | 1 refills | Status: AC

## 2021-04-24 MED ORDER — DOCUSATE SODIUM 100 MG PO CAPS: 100.0000 mg | ORAL_CAPSULE | Freq: Two times a day (BID) | ORAL | 0 refills | Status: AC | PRN

## 2021-04-24 MED ORDER — ENSURE ENLIVE PO LIQD: 237.0000 mL | Freq: Two times a day (BID) | ORAL | 12 refills | Status: AC

## 2021-04-24 MED ORDER — POLYETHYLENE GLYCOL 3350 17 G PO PACK: 17.0000 g | PACK | Freq: Every day | ORAL | 0 refills | Status: AC

## 2021-04-24 MED ORDER — PANTOPRAZOLE SODIUM 40 MG PO TBEC: 40.0000 mg | DELAYED_RELEASE_TABLET | Freq: Every day | ORAL | 0 refills | Status: AC

## 2021-04-24 MED ORDER — COLLAGENASE 250 UNIT/GM EX OINT: TOPICAL_OINTMENT | Freq: Every day | CUTANEOUS | 0 refills | Status: AC

## 2021-04-24 MED ORDER — ASPIRIN 81 MG PO TBEC: 81.0000 mg | DELAYED_RELEASE_TABLET | Freq: Every day | ORAL | 11 refills | Status: AC

## 2021-04-24 NOTE — Progress Notes (Signed)
Physical Therapy Treatment Patient Details Name: Gabriel King MRN: [id number] DOB: 03/10/1932 Today's Date: 04/24/2021    History of Present Illness Pt is an 87 y.o. male presenting to hospital 5/14 with chest pain x2 days and weakness.  Hgb 4.1 in ED; s/p blood transfusion.  Recent cellultis R LE.  Pt admitted with suspected upper GI bleed, severe acute blood loss anemia, Non-STEMI, AKI on CKD 4, and paroxysmal a-fib.  S/p upper GI endoscopy 5/17; s/p colonoscopy 5/18; s/p capsule endoscopy 5/18.  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    PT Comments    Pt resting in bed upon PT arrival; agreeable to PT session.  Semi-supine to sitting edge of bed SBA (increased effort and time for pt to perform on own); mod assist for transfers with RW use (vc's for UE/LE placement required); and CGA to min assist to ambulate 5 feet with RW with chair follow for safety (increased effort and time for pt to take short steps required).  HR 65-79 bpm and O2 sats 98% or greater on room air during sessions activities.  Will continue to focus on strengthening and progressive functional mobility during hospitalization.    Follow Up Recommendations  SNF     Equipment Recommendations  Rolling walker with 5" wheels;3in1 (PT)    Recommendations for Other Services OT consult     Precautions / Restrictions Precautions Precautions: Fall Precaution Comments: R LE wounds; L heel wound Restrictions Weight Bearing Restrictions: No    Mobility  Bed Mobility Overal bed mobility: Needs Assistance Bed Mobility: Supine to Sit     Supine to sit: Supervision;HOB elevated     General bed mobility comments: increased effort and time for pt to perform on own    Transfers Overall transfer level: Needs assistance Equipment  used: Rolling walker (2 wheeled) Transfers: Sit to/from [facility] Sit to Stand: Mod assist Stand pivot transfers: Min assist       General transfer comment: mod assist to stand from bed x1 trial and from recliner x1 trial; mod assist to control descent sitting; min assist stand step turn bed to recliner with RW (increased effort and time to take steps)  Ambulation/Gait Ambulation/Gait assistance: Min guard;Min assist;+2 safety/equipment (chair follow) Gait Distance (Feet): 5 Feet Assistive device: Rolling walker (2 wheeled)   Gait velocity: decreased   General Gait Details: B knees mildly flexed (occasional vc's to straighten knees); decreased B LE step length/foot clearance/heelstrike; increased effort and time to take short steps   Stairs             Wheelchair Mobility    Modified Rankin (Stroke Patients Only)       Balance Overall balance assessment: Needs assistance Sitting-balance support: No upper extremity supported;Feet supported Sitting balance-Leahy Scale: Good Sitting balance - Comments: steady sitting reaching within BOS   Standing balance support: Bilateral upper extremity supported Standing balance-Leahy Scale: Fair Standing balance comment: pt requiring B UE support on RW for static standing balance                            Cognition Arousal/Alertness: Awake/alert Behavior During Therapy: WFL for tasks assessed/performed Overall Cognitive Status: Within Functional Limits for tasks assessed  Exercises   Nursing cleared pt for participation in physical therapy.  Pt agreeable to PT session.    General Comments        Pertinent Vitals/Pain Pain Assessment: Faces Faces Pain Scale: Hurts a little bit Pain Location: tailbone Pain Descriptors / Indicators: Sore Pain Intervention(s): Limited activity within patient's tolerance;Monitored during  session;Repositioned    Home Living                      Prior Function            PT Goals (current goals can now be found in the care plan section) Acute Rehab PT Goals Patient Stated Goal: to improve strength and mobility PT Goal Formulation: With patient Time For Goal Achievement: 05/04/21 Potential to Achieve Goals: Good Progress towards PT goals: Progressing toward goals    Frequency    Min 2X/week      PT Plan Current plan remains appropriate    Co-evaluation              AM-PAC PT "6 Clicks" Mobility   Outcome Measure  Help needed turning from your back to your side while in a flat bed without using bedrails?: A Little Help needed moving from lying on your back to sitting on the side of a flat bed without using bedrails?: A Lot Help needed moving to and from a bed to a chair (including a wheelchair)?: A Little Help needed standing up from a chair using your arms (e.g., wheelchair or bedside chair)?: A Lot Help needed to walk in hospital room?: A Lot Help needed climbing 3-5 steps with a railing? : A Lot 6 Click Score: 14    End of Session Equipment Utilized During Treatment: Gait belt Activity Tolerance: Patient limited by fatigue Patient left: in chair;with call bell/phone within reach;with chair alarm set;with family/visitor present;Other (comment) (B heels floating via pillow support) Nurse Communication: Mobility status;Precautions PT Visit Diagnosis: Other abnormalities of gait and mobility (R26.89);Muscle weakness (generalized) (M62.81);History of falling (Z91.81)     Time: [id number] PT Time Calculation (min) (ACUTE ONLY): 38 min  Charges:  $Gait Training: 8-22 mins $Therapeutic Activity: 23-37 mins                    Kellan Boehlke, PT 04/24/21, 10:14 AM

## 2021-04-24 NOTE — Progress Notes (Incomplete)
Progress Note  Patient Name: Gabriel King Date of Encounter: 04/24/2021  Primary Cardiologist: Cardiologist in [place] better today from a breathing standpoint. No chest pain.  Inpatient Medications    Scheduled Meds: . albuterol  1 puff Inhalation Q6H  . [START ON 04/25/2021] amiodarone  200 mg Oral BID  . amiodarone  400 mg Oral BID  . aspirin EC  81 mg Oral Daily  . atorvastatin  40 mg Oral Daily  . collagenase   Topical Daily  . feeding supplement  237 mL Oral BID BM  . ferrous sulfate  325 mg Oral BID WC  . finasteride  5 mg Oral Daily  . folic acid  1 mg Oral Daily  . guaiFENesin  600 mg Oral BID  . magnesium oxide  400 mg Oral Daily  . melatonin  20 mg Oral QHS  . metoprolol tartrate  12.5 mg Oral BID  . pantoprazole  40 mg Oral Daily  . polyethylene glycol  17 g Oral Daily  . tamsulosin  0.4 mg Oral Daily  . thiamine  100 mg Oral Daily  . torsemide  20 mg Oral Daily  . cyanocobalamin  1,000 mcg Oral Daily   Continuous Infusions:  PRN Meds: acetaminophen, chlorpheniramine-HYDROcodone, docusate sodium, HYDROcodone-acetaminophen, ipratropium-albuterol, menthol-cetylpyridinium   Vital Signs    Vitals:   04/23/21 2039 04/24/21 0356 04/24/21 0446 04/24/21 0728  BP:  (!) 118/55  (!) 112/55  Pulse:  61  63  Resp:  19  16  Temp: 97.8 F (36.6 C) 97.8 F (36.6 C)  98 F (36.7 C)  TempSrc:  Oral    SpO2:  100%  100%  Weight:   84.6 kg   Height:        Intake/Output Summary (Last 24 hours) at 04/24/2021 0836 Last data filed at 04/24/2021 0446 Gross per 24 hour  Intake 720 ml  Output 575 ml  Net 145 ml   Last 3 Weights 04/24/2021 04/23/2021 04/22/2021  Weight (lbs) 186 lb 8.2 oz 185 lb 6.5 oz 181 lb  Weight (kg) 84.6 kg 84.1 kg 82.101 kg      Telemetry    SR-SB, Afib, PVCs- Personally Reviewed  ECG    No new tracings - Personally Reviewed  Physical Exam  *** GEN: No acute distress.   Neck: No JVD Cardiac: RRR, no murmurs, rubs, or  gallops.  Respiratory: Clear to auscultation bilaterally. GI: Soft, nontender, non-distended  MS: No edema; No deformity. Neuro:  Nonfocal  Psych: Normal affect   Labs    High Sensitivity Troponin:   Recent Labs  Lab 04/15/21 0310 04/15/21 0752 04/15/21 1042 04/16/21 1023 04/16/21 1216  TROPONINIHS 535* 6,671* 10,535* 7,737* 7,692*      Chemistry Recent Labs  Lab 04/18/21 0427 04/19/21 [id number] 04/20/21 0339 04/21/21 0339 04/22/21 0416 04/23/21 1037  NA 135   < > 140  --  140 136  K 4.8   < > 5.2* 4.9 5.0 5.1  CL 109   < > 116*  --  115* 111  CO2 19*   < > 17*  --  21* 20*  GLUCOSE 104*   < > 92  --  92 132*  BUN 114*   < > 84*  --  70* 70*  CREATININE 3.49*   < > 3.06*  --  2.85* 2.75*  CALCIUM 7.8*   < > 7.6*  --  7.8* 7.7*  PROT 4.3*  --   --   --   --   --  ALBUMIN 2.2*  --   --   --   --   --   AST 20  --   --   --   --   --   ALT 14  --   --   --   --   --   ALKPHOS 79  --   --   --   --   --   BILITOT 0.8  --   --   --   --   --   GFRNONAA 16*   < > 19*  --  21* 22*  ANIONGAP 7   < > 7  --  4* 5   < > = values in this interval not displayed.     Hematology Recent Labs  Lab 04/19/21 0412 04/21/21 0339 04/22/21 0416 04/23/21 1037  WBC 10.6*  --  9.1 11.6*  RBC 2.96*  --  2.97* 2.58*  HGB 9.3* 8.2* 9.4* 8.2*  HCT 29.1*  --  30.4* 26.4*  MCV 98.3  --  102.4* 102.3*  MCH 31.4  --  31.6 31.8  MCHC 32.0  --  30.9 31.1  RDW 21.2*  --  19.9* 19.6*  PLT 209  --  212 230    BNPNo results for input(s): BNP, PROBNP in the last 168 hours.   DDimer No results for input(s): DDIMER in the last 168 hours.   Radiology    No results found.  Cardiac Studies   Echo 04/16/21 1. Left ventricular ejection fraction, by estimation, is 45 to 50%. The  left ventricle has mildly decreased function. The left ventricle  demonstrates regional wall motion abnormalities (Hypokinesis of the mid to  distal anterior and anteroseptal and  apical region). There is mild  left ventricular hypertrophy. Left  ventricular diastolic parameters are indeterminate.  2. Right ventricular systolic function is normal. The right ventricular  size is normal. There is normal pulmonary artery systolic pressure. The  estimated right ventricular systolic pressure is 123XX123 mmHg.  3. Left atrial size was mildly dilated.  4. The inferior vena cava is dilated in size with >50% respiratory  variability, suggesting right atrial pressure of 8 mmHg.   Patient Profile     87 y.o. male with history of CAD s/p PCI to diag in 2011 and followed by a cardiologist in [place], PAF, CHF, HTN, HLD, CKDIII, COPD, previous tobacco use, anemia on Fe supplementation, and who is being seen today for NSTEMI in the setting of GIB.  Assessment & Plan    Severe Anemia, GIB --GI consulted and unable to find source of bleeding with EGD, colonoscopy, and capsule endoscopy. [place] retrial failed. Continue to hold Eliquis for 2 weeks with further GI workup recommended. Would consider holding ASA as well at this time.  NSTEMI --HS Tn consistent with NSTEMI. LHC deferred due to acute blood loss anemia and renal function. Low dose lopressor as BP allows. Recommend further ischemic workup as OP in [place].   For questions or updates, please contact [place] Please consult [email or web address] for contact info under        Signed, Arvil Chaco, PA-C  04/24/2021, 8:36 AM

## 2021-04-24 NOTE — Care Management Important Message (Signed)
Important Message  Patient Details  Name: Gabriel King MRN: [id number] Date of Birth: 23-May-1932   Medicare Important Message Given:  No  Patient discharged prior to arrival to unit to deliver concurrent Medicare IM.     Dannette Barbara 04/24/2021, 1:41 PM

## 2021-04-24 NOTE — Discharge Instructions (Signed)
Patient is recommended to follow-up with his cardiologist back in [place]. Till then he can follow-up with Dr. Rockey Situ [facility] cardiology

## 2021-04-24 NOTE — TOC Transition Note (Signed)
Transition of Care [facility]) - CM/SW Discharge Note   Patient Details  Name: Gabriel King MRN: [id number] Date of Birth: 07/30/1932  Transition of Care [facility]) CM/SW Contact:  Eileen Stanford, LCSW Phone Number: 04/24/2021, 9:59 AM   Clinical Narrative:   Clinical Social Worker facilitated patient discharge including contacting patient family and facility to confirm patient discharge plans.  Clinical information faxed to facility and family agreeable with plan.  CSW arranged ambulance transport via [facility] to [facility] .  RN to call [phone] for report prior to discharge.      Final next level of care: Skilled Nursing Facility Barriers to Discharge: No Barriers Identified   Patient Goals and CMS Choice Patient states their goals for this hospitalization and ongoing recovery are:: for pt to go to rehab prior to return to [facility]   Choice offered to / list presented to : Patient  Discharge Placement              Patient chooses bed at:  ([facility]) Patient to be transferred to facility by: [facility] Name of family member notified: Santiago Glad Patient and family notified of of transfer: 04/24/21  Discharge Plan and Services In-house Referral: Clinical Social Work   Post Acute Care Choice: [place]                               Social Determinants of Health (SDOH) Interventions     Readmission Risk Interventions No flowsheet data found.

## 2021-04-24 NOTE — Progress Notes (Signed)
Wife at bedside with patient this morning. Patient IVs removed by this RN and dressed in cloth gown ready for discharge to [facility].Report called to RN Kim at [employer]. Vitals WDL at time of discharge. DNR form placed in discharge packet

## 2021-04-24 NOTE — Discharge Summary (Addendum)
[place] [facility] [place] NAME: Gabriel King    MR#:  [id number]  DATE OF BIRTH:  December 21, 1931  DATE OF ADMISSION:  04/15/2021 ADMITTING PHYSICIAN: [name], MD  DATE OF DISCHARGE: 04/24/2021  PRIMARY CARE PHYSICIAN: System, Provider Not In    ADMISSION DIAGNOSIS:  Hyperkalemia [E87.5] Lactic acidosis [E87.2] GIB (gastrointestinal bleeding) [K92.2] NSTEMI (non-ST elevated myocardial infarction) ([place]) [I21.4] Chest pain [R07.9] Nonspecific chest pain [R07.9] Gastrointestinal hemorrhage with melena [K92.1] Symptomatic anemia [D64.9] Acute renal failure superimposed on chronic kidney disease (HCC) [N17.9, N18.9] Acute sepsis ([place]) [A41.9]  DISCHARGE DIAGNOSIS:  Gi Bleed Etiology unclear Severe acute blood loss anemia s/p Blood transfusion NSTEMI in the setting of GI bleed--medical mnx Paroxysmal afib--OFF oral anticoagulation for at least 2 weeks SECONDARY DIAGNOSIS:   Past Medical History:  Diagnosis Date  . CHF (congestive heart failure) ([place])   . CKD (chronic kidney disease)   . COPD (chronic obstructive pulmonary disease) ([place])   . Coronary artery disease   . COVID-19 01/2021  . Hyperlipidemia   . Hypertension     HOSPITAL COURSE:   87 year old male with history of CAD, multiple PCI and stents, chronic CHF, COPD, former smoker, CKD 4, paroxysmal atrial fibrillation on Eliquis, resident of [place], currently visiting [place] for his grandsons graduation/church celebration, presented to the emergency room 5/14 with chest pain, worsening dyspnea on exertion for few weeks, melena. In the ER he was noted to have a hemoglobin of 4, creatinine of 3.7, high-sensitivity Troponin of 10,000.  G.I. bleed-- etiology unclear severe acute blood loss anemia -- patient presented with increasing shortness of breath weakness hemoglobin of 4.0--- four unit blood transfusion -- EGD White nummular lesions in esophageal mucosa.   Brushings performed. - Non-obstructing Schatzki ring. - Medium-sized hiatal hernia. - Erythematous duodenopathy. -- Colonoscopy - Preparation of the colon was poor. - Many 4 to 7 mm, non-bleeding polyps in the sigmoid  colon and in the transverse colon. - Diverticulosis in the entire examined colon. - Blood in the entire examined colon. - The examination was otherwise normal. - Non-bleeding internal hemorrhoids. -- G.I. Dr. Bonna Gains recommends capsule endoscopy which is being performed. --capsule endoscopy--inconclusive --hgb remains between 8.2--9.4 -HOLD ELIQUIS FOR atleast 2 weeks. Pt can f/u Dr Rockey Situ if he is in [facility] for f/u cardiology in [facility]. D/w cardiology about resuming eliquis at later date --on po ferrous sulfate  Hypotension--transient --resolved after IVF bolus --tolerating po BB --low dose  NSTEMI In the setting of G.I. bleed -- high-sensitivity troponin peaked 10,000-- patient has history of CAD with PCI to diagonal in 2011. Follows with cardiology in [place]. -- Per [facility] cardiology continue aspirin, Lipitor 40 mg daily.-- Cardiology does not recommend left heart cath due to active bleeding. -- cont low dose BB with holding parameter--HOLD if SBP <105 --resuming torsemide  Paroxysmal atrial fibrillation -- maintaining sinus rhythm --on  amiodarone 400 mg BID--change to 200 mg bid  From 04/24/2021 -- d/w Dr Rockey Situ hold eliquis for atleast 2 week and consider Cardiology f/u to discuss further plans for resumption in future --on BB  COPD -- stable  Generalized weakness with deconditioning -- PT recommends rehab. TOC for discharge planning.  NO source or etio for sepsis.  Procedures: EGD, colonoscopy, capsule endoscopy Family  communication : wife Santiago Glad on the phone Consults : cardiology, G.I. CODE STATUS: DNR DVT Prophylaxis : eliquis Level of care: Med-Surg Status is: Inpatient   Dispo: The patient is from: Home  Anticipated d/c is to SNF today  Patient currently medically optimized at best              Difficult to place patient No  Will discharged to rehab today. D/w wife  CONSULTS OBTAINED:  Treatment Team:  Constance Haw, MD  DRUG ALLERGIES:   Allergies  Allergen Reactions  . Bactrim [Sulfamethoxazole-Trimethoprim]   . Cefpodoxime Swelling  . Ciprofloxacin   . Ertapenem Anxiety and Other (See Comments)    DISCHARGE MEDICATIONS:   Allergies as of 04/24/2021      Reactions   Bactrim [sulfamethoxazole-trimethoprim]    Cefpodoxime Swelling   Ciprofloxacin    Ertapenem Anxiety, Other (See Comments)      Medication List    STOP taking these medications   apixaban 2.5 MG Tabs tablet Commonly known as: ELIQUIS     TAKE these medications   albuterol 108 (90 Base) MCG/ACT inhaler Commonly known as: VENTOLIN HFA Inhale into the lungs.   amiodarone 200 MG tablet Commonly known as: PACERONE Take 1 tablet (200 mg total) by mouth 2 (two) times daily. Start taking on: Apr 25, 2021   aspirin 81 MG EC tablet Take 1 tablet (81 mg total) by mouth daily. Swallow whole. Start taking on: Apr 25, 2021   atorvastatin 40 MG tablet Commonly known as: LIPITOR Take 40 mg by mouth daily.   Cholecalciferol 50 MCG (2000 UT) Caps Take 2,000 Units by mouth daily.   collagenase ointment Commonly known as: SANTYL Apply topically daily. Start taking on: Apr 25, 2021   cyanocobalamin 1000 MCG tablet Take 1,000 mcg by mouth daily.   docusate sodium 100 MG capsule Commonly known as: COLACE Take 1 capsule (100 mg total) by mouth 2 (two) times daily as needed for mild constipation.   feeding supplement Liqd Take 237 mLs by mouth 2 (two) times daily between  meals.   FeroSul 325 (65 FE) MG tablet Generic drug: ferrous sulfate Take 325 mg by mouth daily.   finasteride 5 MG tablet Commonly known as: PROSCAR Take 5 mg by mouth daily.   guaiFENesin 600 MG 12 hr tablet Commonly known as: MUCINEX Take 1 tablet (600 mg total) by mouth 2 (two) times daily as needed.   Magnesium Oxide 400 MG Caps Take 400 mg by mouth daily.   Melatonin 10 MG Tabs Take 20 mg by mouth at bedtime.   metoprolol tartrate 25 MG tablet Commonly known as: LOPRESSOR Take 0.5 tablets (12.5 mg total) by mouth 2 (two) times daily. What changed: how much to take   pantoprazole 40 MG tablet Commonly known as: PROTONIX Take 1 tablet (40 mg total) by mouth daily. Start taking on: Apr 25, 2021   polyethylene glycol 17 g packet Commonly known as: MIRALAX / GLYCOLAX Take 17 g by mouth daily. Start taking on: Apr 25, 2021   tamsulosin 0.4 MG Caps capsule Commonly known as: FLOMAX Take 0.4 mg by mouth daily after supper.   torsemide 20 MG tablet Commonly known as: DEMADEX Take 20 mg by mouth daily.            Discharge Care Instructions  (From admission, onward)         Start     Ordered   04/24/21 0000  Discharge wound care:       Comments: Silicone foam dressing to right lower extremity wound change every three days. Put collagenase oint on the wound prior to placing foam dressing.   04/24/21 [id number]  If you experience worsening of your admission symptoms, develop shortness of breath, life threatening emergency, suicidal or homicidal thoughts you must seek medical attention immediately by calling 911 or calling your MD immediately  if symptoms less severe.  You Must read complete instructions/literature along with all the possible adverse reactions/side effects for all the Medicines you take and that have been prescribed to you. Take any new Medicines after you have completely understood and accept all the possible adverse reactions/side effects.    Please note  You were cared for by a hospitalist during your hospital stay. If you have any questions about your discharge medications or the care you received while you were in the hospital after you are discharged, you can call the unit and asked to speak with the hospitalist on call if the hospitalist that took care of you is not available. Once you are discharged, your primary care physician will handle any further medical issues. Please note that NO REFILLS for any discharge medications will be authorized once you are discharged, as it is imperative that you return to your primary care physician (or establish a relationship with a primary care physician if you do not have one) for your aftercare needs so that they can reassess your need for medications and monitor your lab values. Today   SUBJECTIVE   No new complaints  weak  VITAL SIGNS:  Blood pressure (!) 112/55, pulse 63, temperature 98 F (36.7 C), resp. rate 16, height '5\' 11"'$  (1.803 m), weight 84.6 kg, SpO2 100 %.  I/O:    Intake/Output Summary (Last 24 hours) at 04/24/2021 0921 Last data filed at 04/24/2021 0800 Gross per 24 hour  Intake 960 ml  Output 575 ml  Net 385 ml    PHYSICAL EXAMINATION:  GENERAL:  87 y.o.-year-old patient lying in the bed with no acute distress.  LUNGS: Normal breath sounds bilaterally, no wheezing, rales, rhonchi. No use of accessory muscles of respiration.  CARDIOVASCULAR: S1, S2 normal. No murmurs, rubs, or gallops.  ABDOMEN: Soft, nontender, nondistended. Bowel sounds present. No organomegaly or mass.  EXTREMITIES:some dependent elbow edema  NEUROLOGIC: Cranial nerves II through XII are intact. No focal Motor or sensory deficits b/l.  deconditioned PSYCHIATRIC:  patient is alert and oriented x 3.  SKIN: chronic right heel ulcer  DATA REVIEW:   CBC  Recent Labs  Lab 04/23/21 1037  WBC 11.6*  HGB 8.2*  HCT 26.4*  PLT 230    Chemistries  Recent Labs  Lab 04/18/21 0427  04/19/21 0412 04/22/21 0416 04/23/21 1037  NA 135   < > 140 136  K 4.8   < > 5.0 5.1  CL 109   < > 115* 111  CO2 19*   < > 21* 20*  GLUCOSE 104*   < > 92 132*  BUN 114*   < > 70* 70*  CREATININE 3.49*   < > 2.85* 2.75*  CALCIUM 7.8*   < > 7.8* 7.7*  MG 1.8   < > 1.9  --   AST 20  --   --   --   ALT 14  --   --   --   ALKPHOS 79  --   --   --   BILITOT 0.8  --   --   --    < > = values in this interval not displayed.    Microbiology Results   Recent Results (from the past 240 hour(s))  Culture, blood (Routine X 2) w  Reflex to ID Panel     Status: None   Collection Time: 04/15/21  1:51 AM   Specimen: BLOOD  Result Value Ref Range Status   Specimen Description BLOOD BLOOD RIGHT FOREARM  Final   Special Requests   Final    BOTTLES DRAWN AEROBIC AND ANAEROBIC Blood Culture adequate volume   Culture   Final    NO GROWTH 5 DAYS Performed at [facility], [address] [place], [place] [address]    Report Status 04/20/2021 FINAL  Final  Culture, blood (Routine X 2) w Reflex to ID Panel     Status: None   Collection Time: 04/15/21  1:52 AM   Specimen: BLOOD  Result Value Ref Range Status   Specimen Description BLOOD RIGHT ANTECUBITAL  Final   Special Requests   Final    BOTTLES DRAWN AEROBIC AND ANAEROBIC Blood Culture adequate volume   Culture   Final    NO GROWTH 5 DAYS Performed at [facility], [address] [place], [place] [address]    Report Status 04/20/2021 FINAL  Final  Urine Culture     Status: None   Collection Time: 04/15/21  3:26 AM   Specimen: Urine, Random  Result Value Ref Range Status   Specimen Description   Final    URINE, RANDOM Performed at [facility], [address] [place], [place] [address]    Special Requests   Final    NONE Performed at [facility], [address], [place], [place] [address]    Culture   Final    NO GROWTH Performed at [facility], [place] [address], [place], [place]  [address]    Report Status 04/16/2021 FINAL  Final  Resp Panel by RT-PCR (Flu A&B, Covid)     Status: None   Collection Time: 04/15/21  4:59 AM   Specimen: Nasopharyngeal(NP) swabs in vial transport medium  Result Value Ref Range Status   SARS Coronavirus 2 by RT PCR NEGATIVE NEGATIVE Final    Comment: (NOTE) SARS-CoV-2 target nucleic acids are NOT DETECTED.  The SARS-CoV-2 RNA is generally detectable in upper respiratory specimens during the acute phase of infection. The lowest concentration of SARS-CoV-2 viral copies this assay can detect is 138 copies/mL. A negative result does not preclude SARS-Cov-2 infection and should not be used as the sole basis for treatment or other patient management decisions. A negative result may occur with  improper specimen collection/handling, submission of specimen other than nasopharyngeal swab, presence of viral mutation(s) within the areas targeted by this assay, and inadequate number of viral copies(<138 copies/mL). A negative result must be combined with clinical observations, patient history, and epidemiological information. The expected result is Negative.  Fact Sheet for Patients:  EntrepreneurPulse.com.au  Fact Sheet for Healthcare Providers:  IncredibleEmployment.be  This test is no t yet approved or cleared by the [place] FDA and  has been authorized for detection and/or diagnosis of SARS-CoV-2 by FDA under an Emergency Use Authorization (EUA). This EUA will remain  in effect (meaning this test can be used) for the duration of the COVID-19 declaration under Section 564(b)(1) of the Act, 21 U.S.C.section 360bbb-3(b)(1), unless the authorization is terminated  or revoked sooner.       Influenza A by PCR NEGATIVE NEGATIVE Final   Influenza B by PCR NEGATIVE NEGATIVE Final    Comment: (NOTE) The Xpert Xpress SARS-CoV-2/FLU/RSV plus assay is intended as an aid in the diagnosis of influenza from  Nasopharyngeal swab specimens and should not be used as a sole basis for treatment. Nasal washings and aspirates are unacceptable for Xpert Xpress SARS-CoV-2/FLU/RSV testing.  Fact Sheet for Patients: EntrepreneurPulse.com.au  Fact Sheet for Healthcare Providers: IncredibleEmployment.be  This test is not yet approved or cleared by the [place] FDA and has been authorized for detection and/or diagnosis of SARS-CoV-2 by FDA under an Emergency Use Authorization (EUA). This EUA will remain in effect (meaning this test can be used) for the duration of the COVID-19 declaration under Section 564(b)(1) of the Act, 21 U.S.C. section 360bbb-3(b)(1), unless the authorization is terminated or revoked.  Performed at [facility], [address] [place], [place] [address]   MRSA PCR Screening     Status: Abnormal   Collection Time: 04/15/21  6:13 AM   Specimen: Nasopharyngeal  Result Value Ref Range Status   MRSA by PCR POSITIVE (A) NEGATIVE Final    Comment:        The GeneXpert MRSA Assay (FDA approved for NASAL specimens only), is one component of a comprehensive MRSA colonization surveillance program. It is not intended to diagnose MRSA infection nor to guide or monitor treatment for MRSA infections. RESULT CALLED TO, READ BACK BY AND VERIFIED WITH: [facility] FURR AT [id number] 04/15/21.PMF Performed at [facility], [address] [place], [place] [address]   KOH prep     Status: None   Collection Time: 04/18/21 12:46 PM   Specimen: Esophagus  Result Value Ref Range Status   Specimen Description ESOPHAGUS  Final   Special Requests Normal  Final   KOH Prep   Final    YEAST WITH PSEUDOHYPHAE Performed at [facility], [address] [place],  [address]    Report Status 04/18/2021 FINAL  Final  Resp Panel by RT-PCR (Flu A&B, Covid) Nasopharyngeal Swab     Status: None   Collection Time: 04/21/21 11:59 AM    Specimen: Nasopharyngeal Swab; Nasopharyngeal(NP) swabs in vial transport medium  Result Value Ref Range Status   SARS Coronavirus 2 by RT PCR NEGATIVE NEGATIVE Final    Comment: (NOTE) SARS-CoV-2 target nucleic acids are NOT DETECTED.  The SARS-CoV-2 RNA is generally detectable in upper respiratory specimens during the acute phase of infection. The lowest concentration of SARS-CoV-2 viral copies this assay can detect is 138 copies/mL. A negative result does not preclude SARS-Cov-2 infection and should not be used as the sole basis for treatment or other patient management decisions. A negative result may occur with  improper specimen collection/handling, submission of specimen other than nasopharyngeal swab, presence of viral mutation(s) within the areas targeted by this assay, and inadequate number of viral copies(<138 copies/mL). A negative result must be combined with clinical observations, patient history, and epidemiological information. The expected result is Negative.  Fact Sheet for Patients:  EntrepreneurPulse.com.au  Fact Sheet for Healthcare Providers:  IncredibleEmployment.be  This test is no t yet approved or cleared by the [place] FDA and  has been authorized for detection and/or diagnosis of SARS-CoV-2 by FDA under an Emergency Use Authorization (EUA). This EUA will remain  in effect (meaning this test can be used) for the duration of the COVID-19 declaration under Section 564(b)(1) of the Act, 21 U.S.C.section 360bbb-3(b)(1), unless the authorization is terminated  or revoked sooner.       Influenza A by PCR NEGATIVE NEGATIVE Final   Influenza B by PCR NEGATIVE NEGATIVE Final    Comment: (NOTE) The Xpert Xpress SARS-CoV-2/FLU/RSV plus assay is  intended as an aid in the diagnosis of influenza from Nasopharyngeal swab specimens and should not be used as a sole basis for treatment. Nasal washings and aspirates are  unacceptable for Xpert Xpress SARS-CoV-2/FLU/RSV testing.  Fact Sheet for Patients: EntrepreneurPulse.com.au  Fact Sheet for Healthcare Providers: IncredibleEmployment.be  This test is not yet approved or cleared by the [place] FDA and has been authorized for detection and/or diagnosis of SARS-CoV-2 by FDA under an Emergency Use Authorization (EUA). This EUA will remain in effect (meaning this test can be used) for the duration of the COVID-19 declaration under Section 564(b)(1) of the Act, 21 U.S.C. section 360bbb-3(b)(1), unless the authorization is terminated or revoked.  Performed at [facility], [address] [place], [place] [address]     RADIOLOGY:  No results found.   CODE STATUS:     Code Status Orders  (From admission, onward)         Start     Ordered   04/15/21 0350  Do not attempt resuscitation (DNR)  Continuous       Question Answer Comment  In the event of cardiac or respiratory ARREST Do not call a "code blue"   In the event of cardiac or respiratory ARREST Do not perform Intubation, CPR, defibrillation or ACLS   In the event of cardiac or respiratory ARREST Use medication by any route, position, wound care, and other measures to relive pain and suffering. May use oxygen, suction and manual treatment of airway obstruction as needed for comfort.      04/15/21 0350        Code Status History    Date Active Date Inactive Code Status Order ID Comments User Context   04/15/2021 0308 04/15/2021 0350 DNR [id number]  Ward, Delice Bison, DO ED   Advance Care Planning Activity    Advance Directive Documentation   Flowsheet Row Most Recent Value  Type of Advance Directive Living will  Pre-existing out of facility DNR order (yellow form or pink MOST form) --  "MOST" Form in Place? --       TOTAL TIME TAKING CARE OF THIS PATIENT: *40* minutes.    [name] M.D  [facility]    CC: Primary  care physician; System, Provider Not In

## 2021-04-24 NOTE — Progress Notes (Addendum)
Progress Note  Patient Name: Gabriel King Date of Encounter: 04/24/2021  [facility] Cardiologist: new to [facility] Followed in [place]  Subjective   Sitting up in bed, eating well, no acute events over the weekend Denies any chest pain or shortness of breath, no leg swelling Telemetry reviewed maintaining normal sinus rhythm No lab work this a.m.  Inpatient Medications    Scheduled Meds: . albuterol  1 puff Inhalation Q6H  . [START ON 04/25/2021] amiodarone  200 mg Oral BID  . amiodarone  400 mg Oral BID  . aspirin EC  81 mg Oral Daily  . atorvastatin  40 mg Oral Daily  . collagenase   Topical Daily  . feeding supplement  237 mL Oral BID BM  . ferrous sulfate  325 mg Oral BID WC  . finasteride  5 mg Oral Daily  . folic acid  1 mg Oral Daily  . guaiFENesin  600 mg Oral BID  . magnesium oxide  400 mg Oral Daily  . melatonin  20 mg Oral QHS  . metoprolol tartrate  12.5 mg Oral BID  . pantoprazole  40 mg Oral Daily  . polyethylene glycol  17 g Oral Daily  . tamsulosin  0.4 mg Oral Daily  . thiamine  100 mg Oral Daily  . torsemide  20 mg Oral Daily  . cyanocobalamin  1,000 mcg Oral Daily   Continuous Infusions:  PRN Meds: acetaminophen, chlorpheniramine-HYDROcodone, docusate sodium, HYDROcodone-acetaminophen, ipratropium-albuterol, menthol-cetylpyridinium   Vital Signs    Vitals:   04/23/21 2039 04/24/21 0356 04/24/21 0446 04/24/21 0728  BP:  (!) 118/55  (!) 112/55  Pulse:  61  63  Resp:  19  16  Temp: 97.8 F (36.6 C) 97.8 F (36.6 C)  98 F (36.7 C)  TempSrc:  Oral    SpO2:  100%  100%  Weight:   84.6 kg   Height:        Intake/Output Summary (Last 24 hours) at 04/24/2021 0911 Last data filed at 04/24/2021 0800 Gross per 24 hour  Intake 960 ml  Output 575 ml  Net 385 ml   Last 3 Weights 04/24/2021 04/23/2021 04/22/2021  Weight (lbs) 186 lb 8.2 oz 185 lb 6.5 oz 181 lb  Weight (kg) 84.6 kg 84.1 kg 82.101 kg      Telemetry    Normal sinus rhythm-  Personally Reviewed  ECG     - Personally Reviewed  Physical Exam   GEN: No acute distress.   Neck: No JVD Cardiac: RRR, no murmurs, rubs, or gallops.  Respiratory: Clear to auscultation bilaterally. GI: Soft, nontender, non-distended  MS: No edema; No deformity. Neuro:  Nonfocal  Psych: Normal affect   Labs    High Sensitivity Troponin:   Recent Labs  Lab 04/15/21 0310 04/15/21 0752 04/15/21 1042 04/16/21 1023 04/16/21 1216  TROPONINIHS 535* 6,671* 10,535* 7,737* 7,692*      Chemistry Recent Labs  Lab 04/18/21 0427 04/19/21 [id number] 04/20/21 0339 04/21/21 0339 04/22/21 0416 04/23/21 1037  NA 135   < > 140  --  140 136  K 4.8   < > 5.2* 4.9 5.0 5.1  CL 109   < > 116*  --  115* 111  CO2 19*   < > 17*  --  21* 20*  GLUCOSE 104*   < > 92  --  92 132*  BUN 114*   < > 84*  --  70* 70*  CREATININE 3.49*   < > 3.06*  --  2.85* 2.75*  CALCIUM 7.8*   < > 7.6*  --  7.8* 7.7*  PROT 4.3*  --   --   --   --   --   ALBUMIN 2.2*  --   --   --   --   --   AST 20  --   --   --   --   --   ALT 14  --   --   --   --   --   ALKPHOS 79  --   --   --   --   --   BILITOT 0.8  --   --   --   --   --   GFRNONAA 16*   < > 19*  --  21* 22*  ANIONGAP 7   < > 7  --  4* 5   < > = values in this interval not displayed.     Hematology Recent Labs  Lab 04/19/21 0412 04/21/21 0339 04/22/21 0416 04/23/21 1037  WBC 10.6*  --  9.1 11.6*  RBC 2.96*  --  2.97* 2.58*  HGB 9.3* 8.2* 9.4* 8.2*  HCT 29.1*  --  30.4* 26.4*  MCV 98.3  --  102.4* 102.3*  MCH 31.4  --  31.6 31.8  MCHC 32.0  --  30.9 31.1  RDW 21.2*  --  19.9* 19.6*  PLT 209  --  212 230    BNPNo results for input(s): BNP, PROBNP in the last 168 hours.   DDimer No results for input(s): DDIMER in the last 168 hours.   Radiology    No results found.  Cardiac Studies     Patient Profile     87 y.o.malewith h/o CAD s/p PCI to diag in 2011 followed by cardiologist in [place], Paroxysmal Afib, CHF, HTN, HLD, CKD  stage 3, COPD, previous tobacco use, alcohol use, anemia on iron who is being seen for NSTEMI in the setting of GIB.  Assessment & Plan    GI bleed/anemia Hemoglobin of 4 on arrival  Received 4 units, Eliquis held GI completed work-up EGD colonoscopy capsule enteroscopy with no source- -attempt to reinitiate Eliquis for 1 day with slight drop in blood count, unclear if this was actual bleed or just lab variability -Decision made to hold Eliquis for 2 weeks Could then reintroduce Eliquis at that time under supervision with his doctors in [place], or locally if still here  Non-STEMI In the setting of severe GI bleed/anemia hemoglobin 4 Not a good candidate for catheterization given creatinine of 3, unclear etiology of his anemia -No anginal symptoms Echocardiogram with ejection fraction 45 to 50% but baseline unclear -No plan for ischemic work-up at this time --On low-dose aspirin, statin, beta-blocker ACE inhibitor, ARB on hold given renal dysfunction and low blood pressure  Paroxysmal atrial fibrillation Noted on arrival to the hospital, converting to normal sinus rhythm on amiodarone -Continue amiodarone 400 twice daily total 7 days then 200 twice daily Eliquis on hold given the above CHADSVASC at least 5 (CAD, HTN, CHF, AGEx2)  Chronic kidney disease stage III Appears to be at his baseline creatinine 3 Needs close follow-up with nephrology in [place] Avoid NSAIDs  Weakness, risk of falls Several hospitalizations over the past year -He has requested rehab -It would appear wife unable to take care of him, she has to go to several graduations  Long discussion with him concerning restart of Eliquis, follow-up in [place]  Total encounter time more than  35 minutes  Greater than 50% was spent in counseling and coordination of care with the patient   For questions or updates, please contact [place] Please consult [email or web address] for contact info under         Signed, Ida Rogue, MD  04/24/2021, 9:11 AM

## 2021-05-02 ENCOUNTER — Encounter: Payer: Self-pay | Admitting: Gastroenterology

## 2021-05-05 ENCOUNTER — Other Ambulatory Visit
Admission: RE | Admit: 2021-05-05 | Discharge: 2021-05-05 | Disposition: A | Discharge status: Home / Self Care | Payer: Medicare Other | Attending: Physician Assistant | Admitting: Physician Assistant

## 2021-05-05 ENCOUNTER — Encounter: Payer: Self-pay | Admitting: Physician Assistant

## 2021-05-05 ENCOUNTER — Other Ambulatory Visit: Payer: Self-pay

## 2021-05-05 ENCOUNTER — Ambulatory Visit (INDEPENDENT_AMBULATORY_CARE_PROVIDER_SITE_OTHER): Payer: Medicare Other | Admitting: Physician Assistant

## 2021-05-05 VITALS — BP 116/56 | HR 49 | Ht 71.0 in | Wt 175.0 lb

## 2021-05-05 DIAGNOSIS — Z79899 Other long term (current) drug therapy: Secondary | ICD-10-CM | POA: Diagnosis present

## 2021-05-05 DIAGNOSIS — I252 Old myocardial infarction: Secondary | ICD-10-CM | POA: Diagnosis not present

## 2021-05-05 DIAGNOSIS — I25118 Atherosclerotic heart disease of native coronary artery with other forms of angina pectoris: Secondary | ICD-10-CM

## 2021-05-05 DIAGNOSIS — I214 Non-ST elevation (NSTEMI) myocardial infarction: Secondary | ICD-10-CM | POA: Diagnosis present

## 2021-05-05 DIAGNOSIS — Z8719 Personal history of other diseases of the digestive system: Secondary | ICD-10-CM

## 2021-05-05 DIAGNOSIS — D500 Iron deficiency anemia secondary to blood loss (chronic): Secondary | ICD-10-CM

## 2021-05-05 DIAGNOSIS — I480 Paroxysmal atrial fibrillation: Secondary | ICD-10-CM | POA: Diagnosis present

## 2021-05-05 DIAGNOSIS — I10 Essential (primary) hypertension: Secondary | ICD-10-CM

## 2021-05-05 DIAGNOSIS — N183 Chronic kidney disease, stage 3 unspecified: Secondary | ICD-10-CM

## 2021-05-05 DIAGNOSIS — E785 Hyperlipidemia, unspecified: Secondary | ICD-10-CM

## 2021-05-05 DIAGNOSIS — J449 Chronic obstructive pulmonary disease, unspecified: Secondary | ICD-10-CM

## 2021-05-05 LAB — COMPREHENSIVE METABOLIC PANEL
ALT: 11 U/L (ref 0–44)
AST: 16 U/L (ref 15–41)
Albumin: 3.1 g/dL — ABNORMAL LOW (ref 3.5–5.0)
Alkaline Phosphatase: 101 U/L (ref 38–126)
Anion gap: 10 (ref 5–15)
BUN: 79 mg/dL — ABNORMAL HIGH (ref 8–23)
CO2: 21 mmol/L — ABNORMAL LOW (ref 22–32)
Calcium: 8.1 mg/dL — ABNORMAL LOW (ref 8.9–10.3)
Chloride: 107 mmol/L (ref 98–111)
Creatinine, Ser: 3.16 mg/dL — ABNORMAL HIGH (ref 0.61–1.24)
GFR, Estimated: 18 mL/min — ABNORMAL LOW (ref >60)
Glucose, Bld: 95 mg/dL (ref 70–99)
Potassium: 4.4 mmol/L (ref 3.5–5.1)
Sodium: 138 mmol/L (ref 135–145)
Total Bilirubin: 0.9 mg/dL (ref 0.3–1.2)
Total Protein: 6 g/dL — ABNORMAL LOW (ref 6.5–8.1)

## 2021-05-05 LAB — CBC
HCT: 28.8 % — ABNORMAL LOW (ref 39.0–52.0)
Hemoglobin: 8.9 g/dL — ABNORMAL LOW (ref 13.0–17.0)
MCH: 31 pg (ref 26.0–34.0)
MCHC: 30.9 g/dL (ref 30.0–36.0)
MCV: 100.3 fL — ABNORMAL HIGH (ref 80.0–100.0)
Platelets: 256 10*3/uL (ref 150–400)
RBC: 2.87 MIL/uL — ABNORMAL LOW (ref 4.22–5.81)
RDW: 16.7 % — ABNORMAL HIGH (ref 11.5–15.5)
WBC: 7.4 10*3/uL (ref 4.0–10.5)
nRBC: 0 % (ref 0.0–0.2)

## 2021-05-05 LAB — T4, FREE: Free T4: 1.35 ng/dL — ABNORMAL HIGH (ref 0.61–1.12)

## 2021-05-05 LAB — TSH: TSH: 2.998 u[IU]/mL (ref 0.350–4.500)

## 2021-05-05 MED ORDER — AMIODARONE HCL 200 MG PO TABS: 200.0000 mg | ORAL_TABLET | Freq: Every day | ORAL | Status: AC

## 2021-05-05 NOTE — Patient Instructions (Signed)
Medication Instructions:  Your physician has recommended you make the following change in your medication:   DECREASE Amiodarone to '200mg'$  DAILY  Continue all other medications as you are taking  *If you need a refill on your cardiac medications before your next appointment, please call your pharmacy*   Lab Work:  Your physician recommends that you have lab work TODAY: CMET, CBC, TSH, Free T4  -  Please go to the [facility]. You will check in at the front desk to the right as you walk into the atrium.  Testing/Procedures: None ordered   Follow-Up: At [facility], you and your health needs are our priority.  As part of our continuing mission to provide you with exceptional heart care, we have created designated Provider Care Teams.  These Care Teams include your primary Cardiologist (physician) and Advanced Practice Providers (APPs -  Physician Assistants and Nurse Practitioners) who all work together to provide you with the care you need, when you need it.  We recommend signing up for the patient portal called "MyChart".  Sign up information is provided on this After Visit Summary.  MyChart is used to connect with patients for Virtual Visits (Telemedicine).  Patients are able to view lab/test results, encounter notes, upcoming appointments, etc.  Non-urgent messages can be sent to your provider as well.   To learn more about what you can do with MyChart, go to [email or web address].    Your next appointment:   3 - 4 week(s)  The format for your next appointment:   In Person  Provider:   You may see Dr. Harrell Gave End or one of the following Advanced Practice Providers on your designated Care Team:     Marrianne Mood, PA-C     Other Instructions  Please monitor BP and HR at [facility]  We will discuss ischemic workup at next follow up visit

## 2021-05-05 NOTE — Progress Notes (Signed)
Office Visit    Patient Name: Gabriel King Date of Encounter: 05/05/2021  PCP:  System, Provider Not In   [place]  Cardiologist: Primary cardiologist in [place], Dr. Harrell Gave End while in [place] Provider:  No care team member to display Electrophysiologist:  None :[phone]}   Chief Complaint    Chief Complaint  Patient presents with  . Other    [place] follow up - Patient c.o some SOB and swelling. Meds reviewed verbally with patient.     87 y.o. male with a hx of CAD s/p 3 stents per review of EMR and followed by cardiologist in [place], paroxysmal atrial fibrillation, HFrEF, hypertension, hyperlipidemia, chronic kidney disease stage III, COPD, previous tobacco use, current alcohol use, anemia on iron supplementation, and who is being seen today for hospital follow-up.  Past Medical History    Past Medical History:  Diagnosis Date  . CHF (congestive heart failure) ([place])   . CKD (chronic kidney disease)   . COPD (chronic obstructive pulmonary disease) ([place])   . Coronary artery disease   . COVID-19 01/2021  . Hyperlipidemia   . Hypertension    Past Surgical History:  Procedure Laterality Date  . COLONOSCOPY WITH PROPOFOL N/A 04/19/2021   Procedure: COLONOSCOPY WITH PROPOFOL;  Surgeon: Virgel Manifold, MD;  Location: [facility] ENDOSCOPY;  Service: Endoscopy;  Laterality: N/A;  . ESOPHAGOGASTRODUODENOSCOPY N/A 04/18/2021   Procedure: ESOPHAGOGASTRODUODENOSCOPY (EGD);  Surgeon: Virgel Manifold, MD;  Location: [facility] ENDOSCOPY;  Service: Endoscopy;  Laterality: N/A;  . GIVENS CAPSULE STUDY N/A 04/19/2021   Procedure: GIVENS CAPSULE STUDY;  Surgeon: Virgel Manifold, MD;  Location: [facility] ENDOSCOPY;  Service: Endoscopy;  Laterality: N/A;  . GIVENS CAPSULE STUDY N/A 04/19/2021   Procedure: GIVENS CAPSULE STUDY;  Surgeon: Virgel Manifold, MD;  Location: [facility] ENDOSCOPY;  Service: Endoscopy;  Laterality: N/A;     Allergies  Allergies  Allergen Reactions  . Bactrim [Sulfamethoxazole-Trimethoprim]   . Cefpodoxime Swelling  . Ciprofloxacin   . Ertapenem Anxiety and Other (See Comments)    History of Present Illness    Gabriel King is a 87 y.o. male with PMH as above.   He has history of CAD with reported 3 stents, PAF, and CHF with EF unknown.  He is reportedly followed by a cardiologist in [place].  He reports a previous history of tobacco use and current alcohol use with 5 ounces of vodka every night.  On review of EMR, most recent medications possibly include albuterol, amiodarone 200 mg daily (though records disagree on if still taking this), Eliquis 2.5 mg twice daily, ASA 81 mg daily, atorvastatin 40 mg daily, losartan 25 mg daily, metoprolol tartrate 25 mg twice daily, tamsulosin 0.4 once per day, and torsemide 10 mg daily (as well as torsemide 20 mg daily) magnesium oxide 400 mg daily.  He reports deconditioning and review of activity shows functional capacity poor at 1-4 METS.  He does not take the stairs but rides the elevator.  He reports weakness.  In the 2 days leading up to his most recent admission, he and his wife had driven up from [place] to see their grandson be recognized for his achievements with the church.  At that time, he started to feel poorly.  In the [facility] emergency department, he reported chest tightness across the anterior chest area and into his shoulders with associated shortness of breath.  He denied this on consultation, however.  At presentation, he was hypotensive, hyperkalemic.  Labs also showed CKD with creatinine 3.74 and BUN 163, severe anemia with hemoglobin 4.1, hematocrit 13.2, leukocytosis with WBC 23.3, lactic acidosis with lactic acid 6.7, hypoalbuminemia at 2.8.  Initial high-sensitivity troponin 369 and recheck 10,535.  EKG NSR without acute ST/T changes.  CT showed pyelonephritis and cystitis.  FOBT obtained and positive.  He was treated with Kcentra and  4 units of PRBC.  He received IV fluids with improvement of his pressures.  He was started on antibiotics and IV Protonix.  GI consulted.  Echo showed LVEF 45 to 50% with hypokinesis in the mid and distal anterior/anteroseptal regions, as well as apical myocardium.  Chronicity of systolic dysfunction was unclear.  He was not deemed a candidate for cardiac catheterization due to acute blood loss anemia.  No bleeding source was found on EGD call, colonoscopy, or capsule endoscopy.  Eliquis was placed on hold for 2 weeks, given recurrent drop in hemoglobin when restarted.  Torsemide and metoprolol held due to hypotension.  It was noted that he could restart Eliquis under the supervision of his doctors in [place] or locally if still here once Eliquis could be restarted in 2 days.  He was continued on ASA, statin, beta-blocker.  Due to renal function, no ACE/ARB.  He was continued on amiodarone taper for paroxysmal atrial fibrillation noted during admission with amiodarone 400 mg twice daily x7 days then 200 mg twice daily.  Chads Vascor was at least 5.  Recommendation was for nephrology referral due to CKD and avoidance of NSAIDs.  It was recommended that he get PT for weakness and falls.  Today, 05/05/2021, he returns to clinic and notes that he is overall doing well since discharge.  He is in SR and bradycardic with plan to drop amiodarone to 200 mg once daily as below with patient denying any dizziness with this lower rate.  He denies any tachypalpitations, presyncope, syncope.  No chest pain or shortness of breath.  He reports that he is able to do 10 minutes of exercise, which is the maximum that he is allotted at this time, and without any exertional symptoms.  He does report some bilateral lower extremity swelling but denies any orthopnea, PND, early satiety, or abdominal distention.  He denies any signs or symptoms of bleeding including hematochezia, melena, and hemoptysis.  He is currently taking Mucinex but is  uncertain of the reason why, though he notes a runny nose sometimes when eating.  Medication compliance confirmed via his aide today from [facility].  He is tolerating all medications well.  He reports that he has several more graduations to attend before he returns to [place].  We reviewed his echocardiogram and reduced pump function with recommendation for further ischemic work-up if still here and once recovered from his most recent admission/GI bleed and out of [facility].  He request access today for his daughter to be able to look at his information on MyChart.  Home Medications   Current Outpatient Medications  Medication Instructions  . albuterol (VENTOLIN HFA) 108 (90 Base) MCG/ACT inhaler Inhalation  . amiodarone (PACERONE) 200 mg, Oral, 2 times daily  . aspirin 81 mg, Oral, Daily, Swallow whole.  Marland Kitchen atorvastatin (LIPITOR) 40 mg, Oral, Daily  . Cholecalciferol 2,000 Units, Oral, Daily  . collagenase (SANTYL) ointment Topical, Daily  . cyanocobalamin 1,000 mcg, Oral, Daily  . docusate sodium (COLACE) 100 mg, Oral, 2 times daily PRN  . feeding supplement (ENSURE ENLIVE / ENSURE PLUS) LIQD 237 mLs, Oral, 2  times daily between meals  . FeroSul 325 mg, Oral, Daily  . finasteride (PROSCAR) 5 mg, Oral, Daily  . guaiFENesin (MUCINEX) 600 mg, Oral, 2 times daily PRN  . Magnesium Oxide 400 mg, Oral, Daily  . Melatonin 20 mg, Oral, Nightly  . metoprolol tartrate (LOPRESSOR) 12.5 mg, Oral, 2 times daily  . pantoprazole (PROTONIX) 40 mg, Oral, Daily  . polyethylene glycol (MIRALAX / GLYCOLAX) 17 g, Oral, Daily  . tamsulosin (FLOMAX) 0.4 mg, Oral, Daily after supper  . torsemide (DEMADEX) 20 mg, Oral, Daily     Review of Systems    He denies chest pain, palpitations, dyspnea, pnd, orthopnea, n, v, dizziness, syncope, weight gain, or early satiety.  He reports ongoing bilateral lower extremity edema with ongoing right leg lower extremity cellulitis and leg today bandaged.  All other  systems reviewed and are otherwise negative except as noted above.  Physical Exam    VS:  BP (!) 116/56 (BP Location: Right Arm, Patient Position: Sitting, Cuff Size: Normal)   Pulse (!) 49   Ht _0  (1.803 m)   Wt 175 lb (79.4 kg)   SpO2 95%   BMI 24.41 kg/m  , BMI Body mass index is 24.41 kg/m. GEN: Elderly male, in no acute distress.  Seated in wheelchair.  Joined by aide from [facility]. HEENT: normal. Neck: Supple, no JVD, carotid bruits, or masses. Cardiac: Bradycardic but regular, 1/6 systolic murmur RUSB, rubs, or gallops. No clubbing, cyanosis, 1-2+ bilateral edema to the knee with right lower extremity bandaged and patient report of chronic cellulitis and left lower extremity skin changes in the setting of chronic edema.  Radials/DP/PT 2+ and equal bilaterally.  Respiratory: L side base rhonchi, cleared with cough otherwise clear to auscultation bilaterally. GI: Soft, nontender, nondistended, BS + x 4. MS: no deformity or atrophy.  Right lower extremity bandage as above Skin: warm and dry, no rash. Neuro:  Strength and sensation are intact. Psych: Normal affect.  Accessory Clinical Findings    ECG personally reviewed by me today - SB 49 bpm- no acute changes.  VITALS Reviewed today   Temp Readings from Last 3 Encounters:  04/24/21 97.9 F (36.6 C)   BP Readings from Last 3 Encounters:  05/05/21 (!) 116/56  04/24/21 110/71   Pulse Readings from Last 3 Encounters:  05/05/21 (!) 49  04/24/21 (!) 52    Wt Readings from Last 3 Encounters:  05/05/21 175 lb (79.4 kg)  04/24/21 186 lb 8.2 oz (84.6 kg)     LABS  reviewed today   Lab Results  Component Value Date   WBC 11.6 (H) 04/23/2021   HGB 8.2 (L) 04/23/2021   HCT 26.4 (L) 04/23/2021   MCV 102.3 (H) 04/23/2021   PLT 230 04/23/2021   Lab Results  Component Value Date   CREATININE 2.75 (H) 04/23/2021   BUN 70 (H) 04/23/2021   NA 136 04/23/2021   K 5.1 04/23/2021   CL 111 04/23/2021   CO2 20 (L)  04/23/2021   Lab Results  Component Value Date   ALT 14 04/18/2021   AST 20 04/18/2021   ALKPHOS 79 04/18/2021   BILITOT 0.8 04/18/2021   Lab Results  Component Value Date   CHOL 78 04/15/2021   HDL 24 (L) 04/15/2021   LDLCALC 33 04/15/2021   TRIG 105 04/15/2021   CHOLHDL 3.3 04/15/2021    Lab Results  Component Value Date   HGBA1C 5.2 04/16/2021   Lab Results  Component Value  Date   TSH 2.065 04/15/2021     STUDIES/PROCEDURES reviewed today   2D echo 04/16/2021: 1. Left ventricular ejection fraction, by estimation, is 45 to 50%. The  left ventricle has mildly decreased function. The left ventricle  demonstrates regional wall motion abnormalities (Hypokinesis of the mid to  distal anterior and anteroseptal and  apical region). There is mild left ventricular hypertrophy. Left  ventricular diastolic parameters are indeterminate.  2. Right ventricular systolic function is normal. The right ventricular  size is normal. There is normal pulmonary artery systolic pressure. The  estimated right ventricular systolic pressure is 31.5 mmHg.  3. Left atrial size was mildly dilated.  4. The inferior vena cava is dilated in size with >50% respiratory  variability, suggesting right atrial pressure of 8 mmHg.  Assessment & Plan    NSTEMI -- No chest pain or shortness of breath at rest or with exertion today and since discharge from the hospital.  Recent admission at [facility] with high-sensitivity troponin peaking at 10,535.  Prior notes indicate nonobstructive disease per review of EMR --unfortunately, I do not have access to his records in [place] at this time.  Echo during admission with EF 45 to 50% with anterior, anteroseptal, and able goal hypokinesis with prior notes indicating normal EF per EMR.  EKG today shows sinus bradycardia without acute ST/T changes.  Given his severe symptomatic anemia requiring transfusion during admission and CKD stage III/IV, he was not considered a  catheterization candidate during his admission.  As previously noted, cannot exclude some degree of troponin elevation during his admission due to supply demand ischemia; however, given his reduced EF, further ischemic work-up recommended if renal function and anemia allow and once recovered from his illness.  He has been holding his Eliquis due to symptomatic anemia and we will recheck labs today to confirm restart is acceptable.  Continue current atorvastatin and Lopressor.  New systolic heart failure -- Denies any shortness of breath at rest or with exertion.  Echo as above with newly reduced EF.  Continue current medications.  PAF --Due to bradycardic rate, will reduce amiodarone to 200 mg daily.  Pending recheck of CBC, restart of Eliquis.  Continue to hold for now and until labs are resulted.  We will recheck TSH, free T4, and liver function today on amiodarone.  Acute on chronic CKD -- Recheck a BMET today.  Medication changes: Decrease to amiodarone 200 mg daily.  Recommendations regarding Eliquis pending recheck of CBC. Labs ordered: C-Met, CBC, TSH, free T4 Studies / Imaging ordered: Labs at this time, consider stress testing at RTC if still local. Disposition: Pending length of time in [place], labs  *Please be aware that the above documentation was completed voice recognition software and may contain dictation errors.      Arvil Chaco, PA-C 05/05/2021

## 2021-05-08 ENCOUNTER — Telehealth: Payer: Self-pay | Admitting: Physician Assistant

## 2021-05-08 NOTE — Telephone Encounter (Signed)
Arvil Chaco, PA-C  05/05/2021 6:05 PM EDT      Labs show --TSH WNL --Free T4 slightly elevated at 1.35 --Renal function bumped from previous labs but within normal range on review of all previous BMET --Albumin low, though improved from 2 weeks ago --Blood counts stable from previous labs, though he is still very anemic  Recommendations: --Continue reduced dose amiodarone 200 mg daily. Given his renal function / ongoing significant anemia, as well as his plans for travel (which would make it difficult for [place] to keep a close eye on his labs after restart of anticoagulation), will continue to hold off on restart of anticoagulation for now with recommendation for repeat labs at RTC. Recommend that he establish with a nephrologist if he plans to stay in [place] for an extended period of time. With ongoing anemia, we may want to have him revisit GI again as well.

## 2021-05-08 NOTE — Telephone Encounter (Signed)
Attempted to call the patient's number multiple times- message received stating the "# is not in service."  No DPR on file to speak with anyone else.  Will attempt to call back at a later time.

## 2021-05-09 NOTE — Telephone Encounter (Signed)
Attempted to call the patient x 2 attempts- message received stating the "# dialed is not in service." No DPR on file.  Will attempt to call back at a later time

## 2021-05-09 NOTE — Telephone Encounter (Signed)
Was able to call Mr. Sollazzo back, he has finished his therapy session, reviewed his recent labs results again with pt so that he may take notes  Labs show  --TSH WNL  --Free T4 slightly elevated at 1.35  --Renal function bumped from previous labs but within normal range on review of all previous BMET  --Albumin low, though improved from 2 weeks ago  --Blood counts stable from previous labs, though he is still very anemic   Recommendations:  --Continue reduced dose amiodarone 200 mg daily. Given his renal function / ongoing significant anemia, as well as his plans for travel (which would make it difficult for [place] to keep a close eye on his labs after restart of anticoagulation), will continue to hold off on restart of anticoagulation for now with recommendation for repeat labs at RTC. Recommend that he establish with a nephrologist if he plans to stay in [place] for an extended period of time. With ongoing anemia, we may want to have him revisit GI again as well.   Mr. Deutmeyer reports his has been on the amiodarone 200 mg daily, understands to keep holding anticoagulation d/t anemia.   Mr. Stetzer reports he lives in [place], will try to keep app here in [facility] on 6/29 with Mickle Plumb and have labs repeated then. Advised that if he chooses to resume care back at home, records can be faxed to cardiologist office of choice for continuous of care. He also reports may try to find nephrologist in [place] that is closer to home, verbalized understanding and agreed with plan.   Mr. Simon reports that he is "very pleased with the treatment I received at your facility, everyone from ICU to your office were very nice, professional, and knowledgeable" Was very thankful to Mr. Denigris kind words, glad he had an overall great experience with our facilities. Mr. Bessey repors he looks forward to visiting our facility in the future, will try to keep appt at end on June 6/29 as he would need to be in [place]. If not will seek  continuing care in [place].

## 2021-05-09 NOTE — Telephone Encounter (Signed)
This RN attempted to call the patient for 3rd time Noted phone message of  "Number dialed is not in service." No DPR on file.  Called pt's spouse Santiago Glad on his demographics, she reports "his phone is working because I just got off hte phone with him) Santiago Glad was able to give pts cell number [phone] (correct number)  Wife wanted results, advised could not release d/t no DPR on file, she verbalized understanding, did suggest at pt's upcoming f/u appt that he have DPR signed so we may speak to Santiago Glad in the future.  Called Mr. Vosburg with corrected number, was able to reach him about his recent labs.  Labs show  --TSH WNL  --Free T4 slightly elevated at 1.35  --Renal function bumped from previous labs but within normal range on review of all previous BMET  --Albumin low, though improved from 2 weeks ago  --Blood counts stable from previous labs, though he is still very anemic   Recommendations:  --Continue reduced dose amiodarone 200 mg daily. Given his renal function / ongoing significant anemia, as well as his plans for travel (which would make it difficult for [place] to keep a close eye on his labs after restart of anticoagulation), will continue to hold off on restart of anticoagulation for now with recommendation for repeat labs at RTC. Recommend that he establish with a nephrologist if he plans to stay in [place] for an extended period of time. With ongoing anemia, we may want to have him revisit GI again as well.   Mr. Feezor is currently getting ready to start therapy, he asked if someone would call him back in a hour or later to review results again so that he can "pay more attention" as he is currently at an appt.   Will call back to review results for convenience of pt.

## 2021-05-31 ENCOUNTER — Ambulatory Visit: Payer: Medicare Other | Admitting: Physician Assistant

## 2021-06-07 ENCOUNTER — Telehealth: Payer: Self-pay | Admitting: *Deleted

## 2021-06-07 ENCOUNTER — Encounter: Payer: Self-pay | Admitting: *Deleted

## 2021-06-07 NOTE — Telephone Encounter (Signed)
Gabriel Manifold, MD  Dionisio David, Tressia Miners L Please set up clinic appointment with me or Dr. Marius Ditch for hospital follow up for anemia in 4-6 weeks    No answer no voicemail. Mailed letter to home.

## 2021-06-14 NOTE — Telephone Encounter (Signed)
Patient states he lives in [place] and is going to follow up with his PCP down there

## 2022-04-04 IMAGING — US US RENAL
1 series · 14 of 25 positions shown · non-contrast
Comparison: CT of the abdomen and pelvis on 04/15/2021

CLINICAL DATA: Acute renal failure.

EXAM:
RENAL / URINARY TRACT ULTRASOUND COMPLETE

[Series 1: us renal · 14 of 64 slices shown]
[im 1/64]
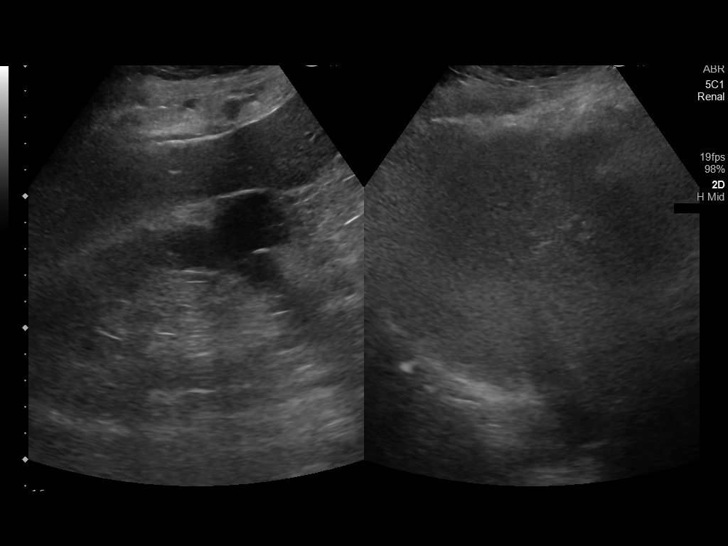
[im 6/64]
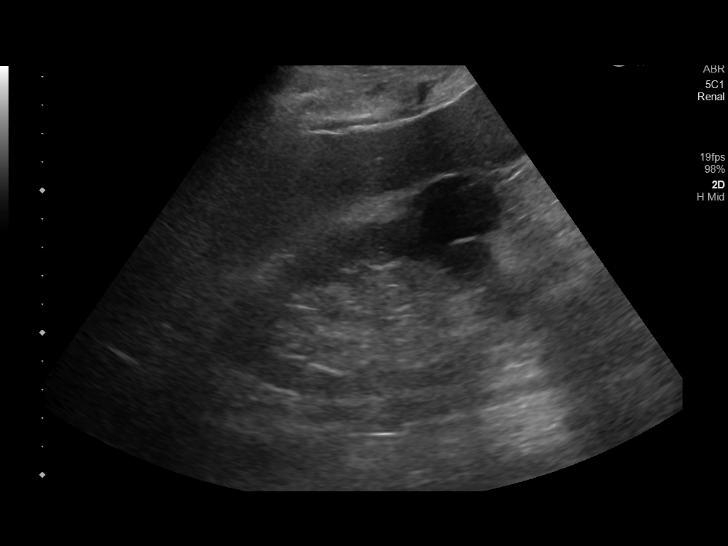
[im 11/64]
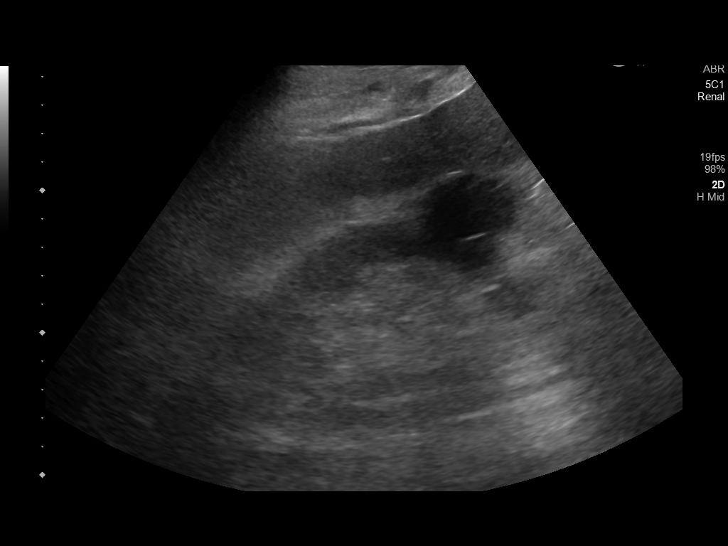
[im 16/64]
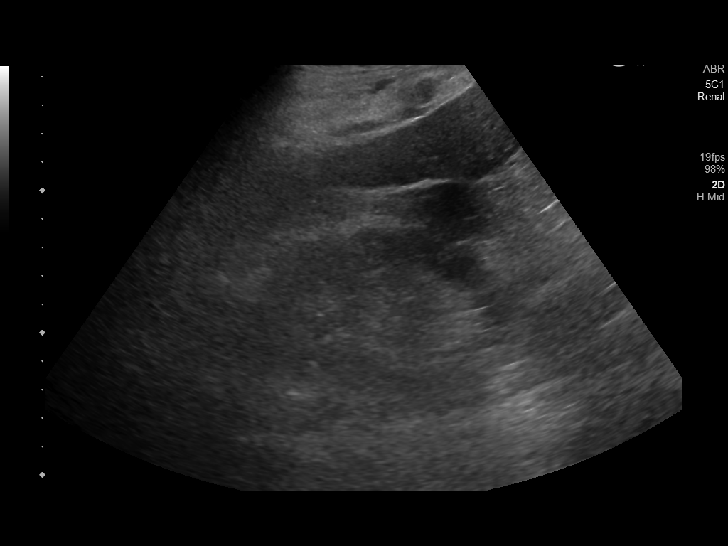
[im 22/64]
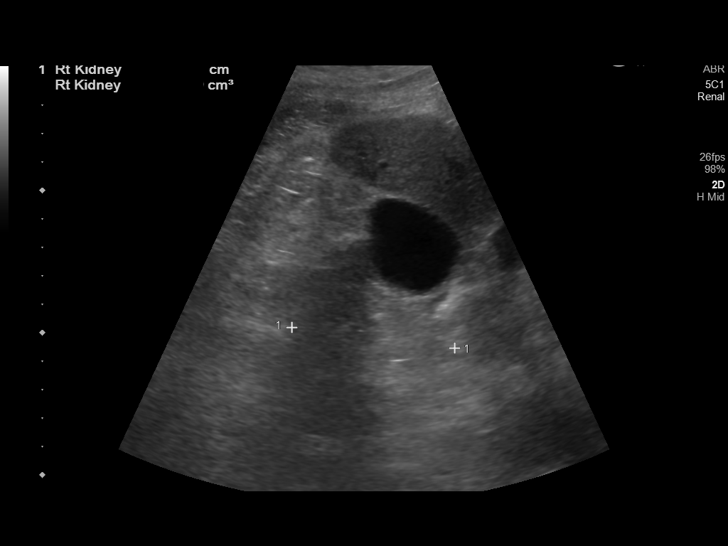
[im 24/64]
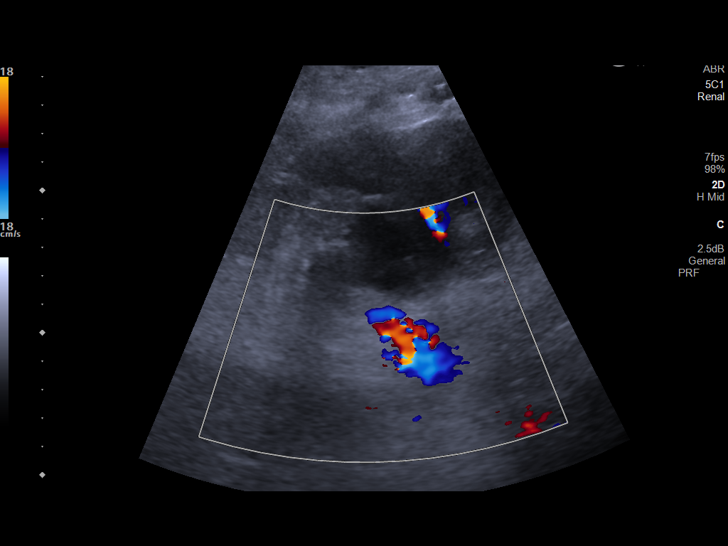
[im 29/64]
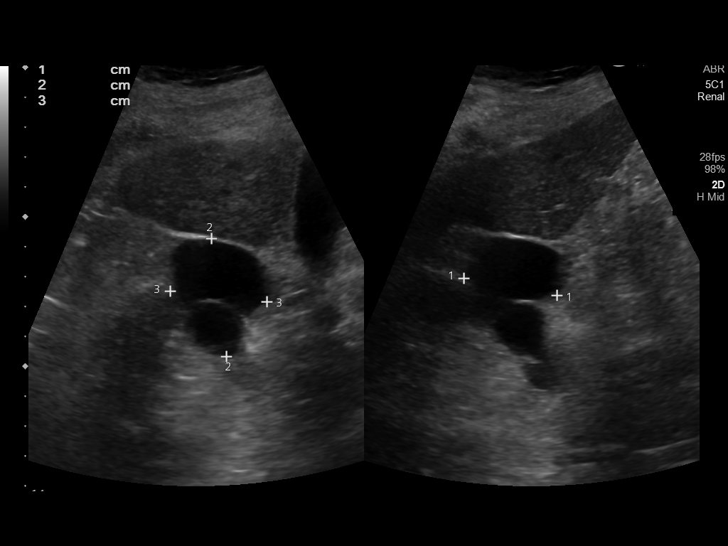
[im 35/64]
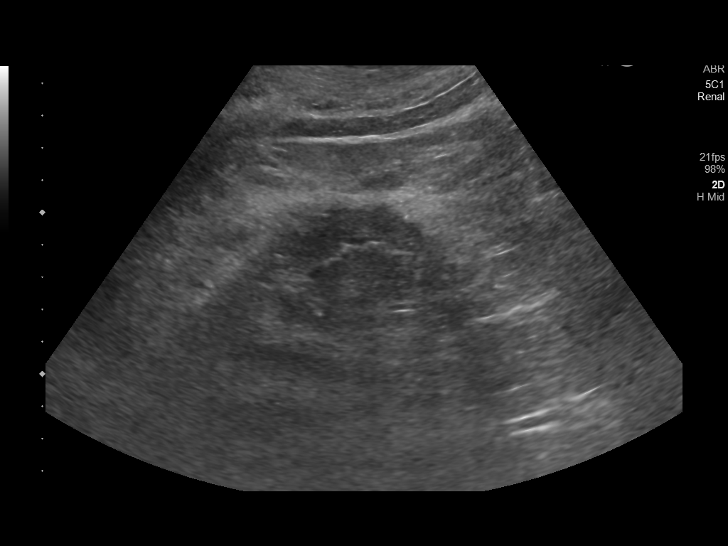
[im 40/64]
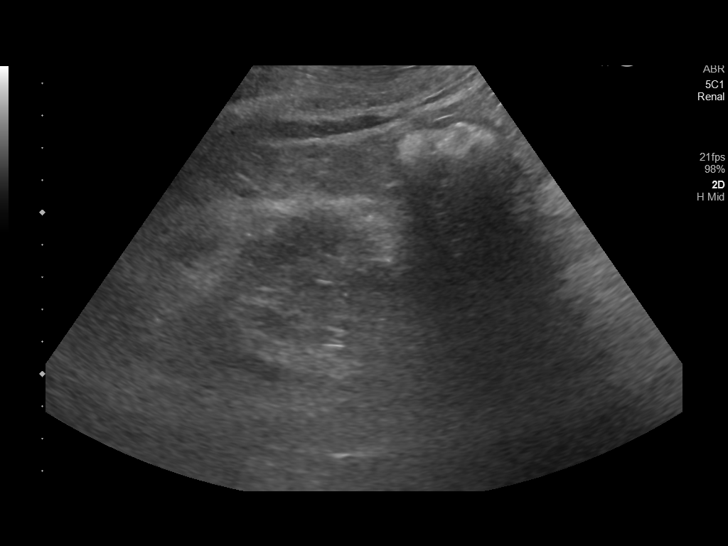
[im 43/64]
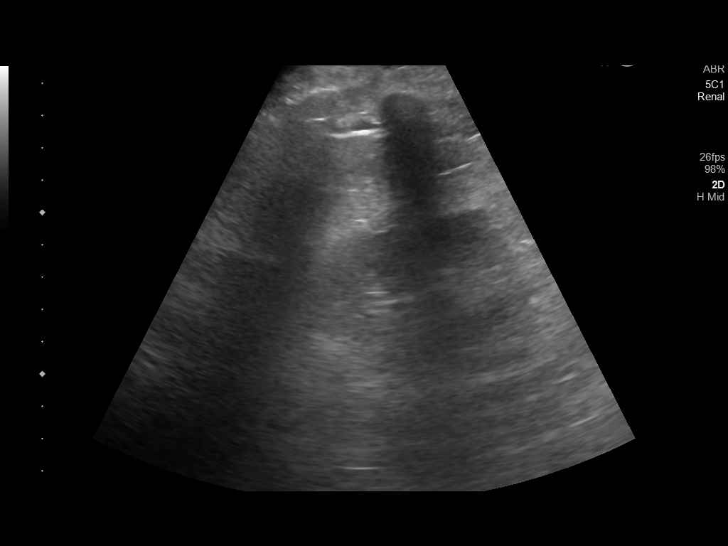
[im 48/64]
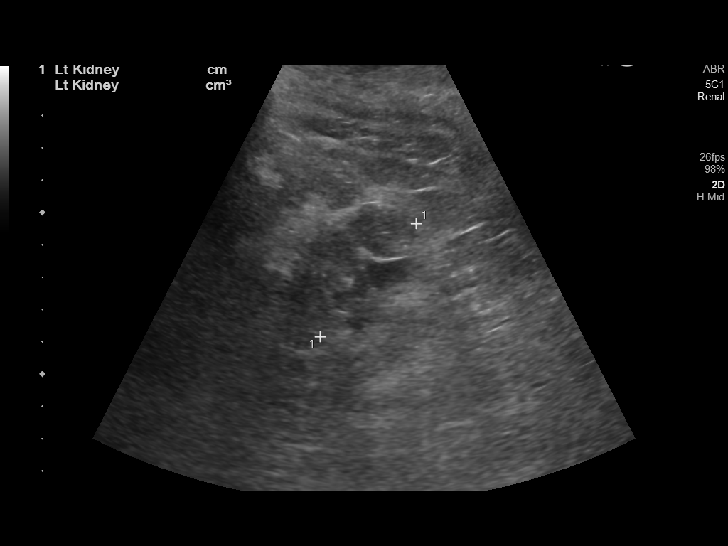
[im 53/64]
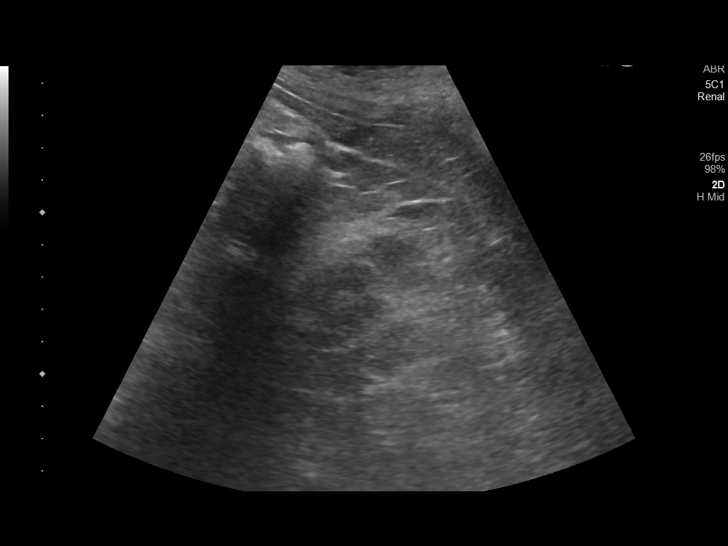
[im 58/64]
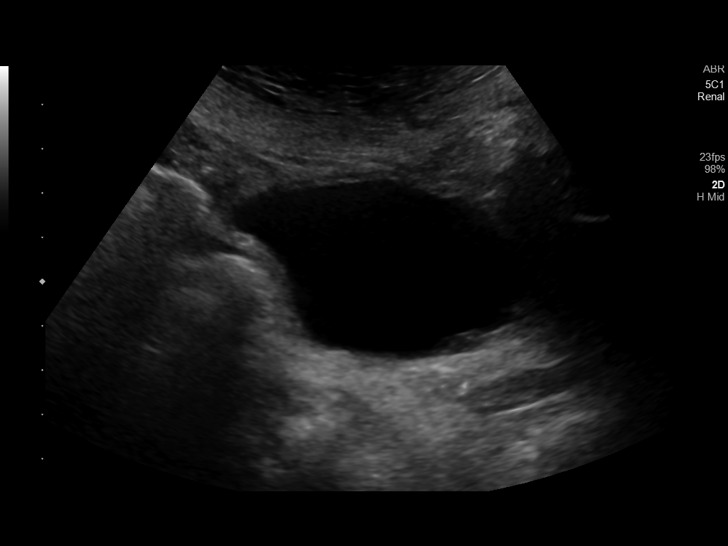
[im 64/64]
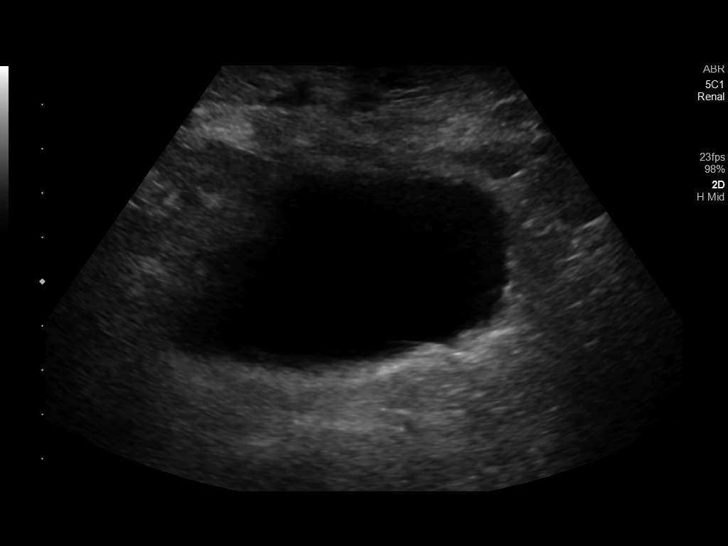

[14 of 25 positions shown; findings below may reference images not displayed]

FINDINGS: Right Kidney:

Renal measurements: 11.3 x 6.2 x 5.8 centimeter = volume: 209.9 mL.
Bilobed cyst or 2 adjacent cysts in the LOWER pole region RIGHT
kidney measure 3.2 x 4.0 x 3.3 centimeters. Renal parenchyma is
hyperechoic. No hydronephrosis.

Left Kidney:

Renal measurements: 8.3 x 5.4 x 4.6 centimeter = volume: 107.9 mL.
Cyst in the midpole region of the LEFT kidney is 1.1 x 0.9 x
centimeters. Renal parenchyma is hyperechoic. No hydronephrosis.

Bladder:

Appears normal for degree of bladder distention.

Other:

Study quality is degraded by patient's condition.
IMPRESSION: 1. No hydronephrosis.
2. Bilateral echogenic renal parenchyma.
3. Bilateral benign cysts.

## 2023-01-03 DEATH — deceased
